# Patient Record
Sex: Male | Born: 1945 | Race: White | Hispanic: No | Marital: Married | Smoking: Former smoker
Health system: Southern US, Community
[De-identification: ages and names within clinical notes are randomized; demographics above are authoritative.]

## PROBLEM LIST (undated history)

## (undated) DIAGNOSIS — R42 Dizziness and giddiness: Secondary | ICD-10-CM

## (undated) DIAGNOSIS — I714 Abdominal aortic aneurysm, without rupture, unspecified: Secondary | ICD-10-CM

## (undated) DIAGNOSIS — E782 Mixed hyperlipidemia: Secondary | ICD-10-CM

## (undated) DIAGNOSIS — I2510 Atherosclerotic heart disease of native coronary artery without angina pectoris: Secondary | ICD-10-CM

## (undated) DIAGNOSIS — I723 Aneurysm of iliac artery: Secondary | ICD-10-CM

## (undated) HISTORY — DX: Dizziness and giddiness: R42

## (undated) HISTORY — DX: Aneurysm of iliac artery: I72.3

## (undated) HISTORY — DX: Mixed hyperlipidemia: E78.2

## (undated) HISTORY — DX: Atherosclerotic heart disease of native coronary artery without angina pectoris: I25.10

## (undated) HISTORY — DX: Abdominal aortic aneurysm, without rupture: I71.4

## (undated) HISTORY — DX: Abdominal aortic aneurysm, without rupture, unspecified: I71.40

---

## 2004-02-06 ENCOUNTER — Ambulatory Visit (HOSPITAL_COMMUNITY): Admission: RE | Admit: 2004-02-06 | Discharge: 2004-02-06 | Payer: Self-pay | Admitting: Orthopedic Surgery

## 2005-10-30 ENCOUNTER — Encounter: Payer: Self-pay | Admitting: Emergency Medicine

## 2005-10-31 ENCOUNTER — Inpatient Hospital Stay (HOSPITAL_COMMUNITY): Admission: EM | Admit: 2005-10-31 | Discharge: 2005-11-01 | Payer: Self-pay | Admitting: Internal Medicine

## 2005-10-31 ENCOUNTER — Ambulatory Visit: Payer: Self-pay | Admitting: *Deleted

## 2005-11-20 ENCOUNTER — Ambulatory Visit: Payer: Self-pay | Admitting: *Deleted

## 2006-01-01 ENCOUNTER — Ambulatory Visit: Payer: Self-pay | Admitting: Cardiology

## 2006-01-20 ENCOUNTER — Ambulatory Visit: Payer: Self-pay | Admitting: Cardiology

## 2006-04-10 ENCOUNTER — Ambulatory Visit: Payer: Self-pay

## 2006-04-10 ENCOUNTER — Ambulatory Visit: Payer: Self-pay | Admitting: Cardiology

## 2006-05-21 ENCOUNTER — Ambulatory Visit: Payer: Self-pay | Admitting: Cardiology

## 2006-10-21 ENCOUNTER — Ambulatory Visit: Payer: Self-pay | Admitting: Cardiology

## 2006-10-27 ENCOUNTER — Ambulatory Visit: Payer: Self-pay | Admitting: Cardiology

## 2006-10-27 LAB — CONVERTED CEMR LAB
ALT: 21 units/L (ref 0–53)
AST: 20 units/L (ref 0–37)
Albumin: 3.6 g/dL (ref 3.5–5.2)
Alkaline Phosphatase: 105 units/L (ref 39–117)
BUN: 14 mg/dL (ref 6–23)
Basophils Absolute: 0 10*3/uL (ref 0.0–0.1)
Basophils Relative: 0.6 % (ref 0.0–1.0)
Bilirubin, Direct: 0.1 mg/dL (ref 0.0–0.3)
CO2: 27 meq/L (ref 19–32)
Calcium: 9 mg/dL (ref 8.4–10.5)
Chloride: 113 meq/L — ABNORMAL HIGH (ref 96–112)
Cholesterol: 110 mg/dL (ref 0–200)
Creatinine, Ser: 1 mg/dL (ref 0.4–1.5)
Eosinophils Absolute: 0.2 10*3/uL (ref 0.0–0.6)
Eosinophils Relative: 3.7 % (ref 0.0–5.0)
GFR calc Af Amer: 99 mL/min
GFR calc non Af Amer: 82 mL/min
Glucose, Bld: 101 mg/dL — ABNORMAL HIGH (ref 70–99)
HCT: 41 % (ref 39.0–52.0)
HDL: 32 mg/dL — ABNORMAL LOW (ref >39.0)
Hemoglobin: 14.1 g/dL (ref 13.0–17.0)
LDL Cholesterol: 50 mg/dL (ref 0–99)
Lymphocytes Relative: 33.5 % (ref 12.0–46.0)
MCHC: 34.4 g/dL (ref 30.0–36.0)
MCV: 89.8 fL (ref 78.0–100.0)
Monocytes Absolute: 0.4 10*3/uL (ref 0.2–0.7)
Monocytes Relative: 6.1 % (ref 3.0–11.0)
Neutro Abs: 3.8 10*3/uL (ref 1.4–7.7)
Neutrophils Relative %: 56.1 % (ref 43.0–77.0)
Platelets: 302 10*3/uL (ref 150–400)
Potassium: 4 meq/L (ref 3.5–5.1)
RBC: 4.56 M/uL (ref 4.22–5.81)
RDW: 12.8 % (ref 11.5–14.6)
Sodium: 144 meq/L (ref 135–145)
TSH: 2.92 microintl units/mL (ref 0.35–5.50)
Total Bilirubin: 0.8 mg/dL (ref 0.3–1.2)
Total CHOL/HDL Ratio: 3.4
Total Protein: 6.2 g/dL (ref 6.0–8.3)
Triglycerides: 141 mg/dL (ref 0–149)
VLDL: 28 mg/dL (ref 0–40)
WBC: 6.6 10*3/uL (ref 4.5–10.5)

## 2007-11-04 ENCOUNTER — Encounter: Admission: RE | Admit: 2007-11-04 | Discharge: 2007-11-04 | Payer: Self-pay | Admitting: Family Medicine

## 2008-12-28 ENCOUNTER — Encounter: Admission: RE | Admit: 2008-12-28 | Discharge: 2008-12-28 | Payer: Self-pay | Admitting: Family Medicine

## 2009-11-27 ENCOUNTER — Encounter: Payer: Self-pay | Admitting: Cardiology

## 2009-11-28 ENCOUNTER — Telehealth: Payer: Self-pay | Admitting: Cardiology

## 2009-11-28 ENCOUNTER — Encounter: Admission: RE | Admit: 2009-11-28 | Discharge: 2009-11-28 | Payer: Self-pay | Admitting: Family Medicine

## 2009-12-21 ENCOUNTER — Telehealth: Payer: Self-pay | Admitting: Cardiology

## 2010-01-16 ENCOUNTER — Ambulatory Visit: Payer: Self-pay | Admitting: Cardiology

## 2010-01-16 DIAGNOSIS — E782 Mixed hyperlipidemia: Secondary | ICD-10-CM | POA: Insufficient documentation

## 2010-01-16 DIAGNOSIS — R42 Dizziness and giddiness: Secondary | ICD-10-CM | POA: Insufficient documentation

## 2010-01-16 DIAGNOSIS — I2510 Atherosclerotic heart disease of native coronary artery without angina pectoris: Secondary | ICD-10-CM | POA: Insufficient documentation

## 2010-01-17 ENCOUNTER — Telehealth: Payer: Self-pay | Admitting: Cardiology

## 2010-05-15 ENCOUNTER — Telehealth: Payer: Self-pay | Admitting: Cardiology

## 2010-05-22 NOTE — Assessment & Plan Note (Signed)
Summary: f3y      Allergies Added:   Visit Type:  Follow-up 3 year  CC:  pt states he uses patches and gum he does not really smoke. Pt  had one eposide of dizziness he went to Dr had Ct Scan pt states he has not had any more dizziness since then.  History of Present Illness: The patient is 65 years old and return for management of CAD and for evaluation of dizziness and an irregular heartbeat. In 2007 he had a non-ST elevation MI and was treated with multiple drug-eluting stents to the circumflex and right coronary artery. His ejection fraction was 45% at that time.  He is doing well since that time. Recently he developed symptoms of dizziness and had documented PACs on echocardiogram by his primary care physician. The ECG was faxed to [place] and showed PACs. Since that time his symptoms have resolved.  His other medical problems include hypertension hyperlipidemia.  He works for [employer]  He has been able to stop smoking but still uses Nicorette, and patches from time to time.  Current Medications (verified): 1)  Enalapril Maleate 2.5 Mg Tabs (Enalapril Maleate) .Marland Kitchen.. 1 Tab Once Daily 2)  Metoprolol Succinate 25 Mg Xr24h-Tab (Metoprolol Succinate) .... Take One Tablet By Mouth Daily 3)  Plavix 75 Mg Tabs (Clopidogrel Bisulfate) .Marland Kitchen.. 1 Tab Once Daily 4)  Aspirin 81 Mg Tbec (Aspirin) .... Take One Tablet By Mouth Daily 5)  Red Yeast .... 1 Tab Qd 6)  Multi Vitamin .Marland Kitchen.. 1 Tab Once Daily 7)  Lipitor 40 Mg Tabs (Atorvastatin Calcium) .Marland Kitchen.. 1 Tab Once Daily  Allergies (verified): 1)  ! Pcn  Past History:  Past Medical History: Reviewed history from 01/16/2010 and no changes required.  1. Coronary artery disease status post non-ST-elevation myocardial       infarction in July 2007 with multi vessel percutaneous coronary       intervention of the circumflex and right coronary artery with       multiple drug-eluting stents.   2. Ejection fraction 46%.   3.  Hyperlipidemia with low HDL.   4. Previous cigarette use.   5. history of Peyronie's disease  Review of Systems       ROS is negative except as outlined in HPI.   Vital Signs:  Patient profile:   65 year old male Height:      69 inches Weight:      194 pounds BMI:     28.75 Pulse rate:   77 / minute Pulse (ortho):   53 / minute BP sitting:   106 / 71  (left arm) BP standing:   102 / 66 Cuff size:   large  Vitals Entered By: Burnett Kanaris, CNA (January 16, 2010 2:30 PM)  Serial Vital Signs/Assessments:  Time      Position  BP       Pulse  Resp  Temp     By 0 min     Lying RA  100/70   47                    Burnett Kanaris, CNA 0 min     Sitting   98/65    51                    Burnett Kanaris, CNA 0 min     Standing  102/66   53  Burnett Kanaris, CNA 2 min     Standing  98/68    55                    Burnett Kanaris, CNA 3 min     Standing  93/60    64                    Burnett Kanaris, CNA  Comments: 0 min no dizziness  By: Burnett Kanaris, CNA  2 min no dizziness By: Burnett Kanaris, CNA  3 min  no dizziness By: Burnett Kanaris, CNA    Physical Exam  Additional Exam:  Gen. Well-nourished, in no distress   Neck: No JVD, thyroid not enlarged, no carotid bruits Lungs: No tachypnea, clear without rales, rhonchi or wheezes Cardiovascular: Rhythm regular, PMI not displaced,  heart sounds  normal, no murmurs or gallops, no peripheral edema, pulses normal in all 4 extremities. Abdomen: BS normal, abdomen soft and non-tender without masses or organomegaly, no hepatosplenomegaly. MS: No deformities, no cyanosis or clubbing   Neuro:  No focal sns   Skin:  no lesions    Impression & Recommendations:  Problem # 1:  CORONARY ATHEROSCLEROSIS NATIVE CORONARY ARTERY (ICD-414.01) He had multiple drug-eluting stents in the right coronary and circumflex artery after a non-ST elevation MI in 2007. He's had no recent chest pain this appears stable. His updated medication  list for this problem includes:    Enalapril Maleate 2.5 Mg Tabs (Enalapril maleate) .Marland Kitchen... 1 tab once daily    Metoprolol Succinate 25 Mg Xr24h-tab (Metoprolol succinate) .Marland Kitchen... Take one tablet by mouth daily    Plavix 75 Mg Tabs (Clopidogrel bisulfate) .Marland Kitchen... 1 tab once daily    Aspirin 81 Mg Tbec (Aspirin) .Marland Kitchen... Take one tablet by mouth daily  Orders: EKG w/ Interpretation (93000)  Problem # 2:  MIXED HYPERLIPIDEMIA (ICD-272.2) This is managed with Lipitor and followed by his primary care physician. His updated medication list for this problem includes:    Lipitor 40 Mg Tabs (Atorvastatin calcium) .Marland Kitchen... 1 tab once daily  Orders: EKG w/ Interpretation (93000)  Problem # 3:  DIZZINESS (ICD-780.4) He has been having symptoms of dizziness in the past.  His ECG shows APCs and is otherwise normal. He is blood pressure is on the low side but there is no significant postural drop. His symptoms have since resolved. Since he is not having any recurrent symptoms I don't think we need to evaluate this further at this time. He has more problems and will consider evaluation later.  Orders: EKG w/ Interpretation (93000)  Patient Instructions: 1)  Your physician recommends that you continue on your current medications as directed. Please refer to the Current Medication list given to you today. 2)  Your physician wants you to follow-up in: 1 year with Dr. Clifton James.  You will receive a reminder letter in the mail two months in advance. If you don't receive a letter, please call our office to schedule the follow-up appointment.

## 2010-05-22 NOTE — Progress Notes (Signed)
Summary: needs refiil   Phone Note Refill Request Call back at Home Phone [phone] Message from:  Patient on [facility] [address]  Refills Requested: Medication #1:  LIPITOR 40 MG TABS 1 tab once daily. Initial call taken by: Omer Jack,  January 17, 2010 4:11 PM  Follow-up for Phone Call        The pt's wife is aware this has been done. Follow-up by: Sherri Rad, RN, BSN,  January 17, 2010 4:18 PM    Prescriptions: LIPITOR 40 MG TABS (ATORVASTATIN CALCIUM) 1 tab once daily  #90 x 3   Entered by:   Sherri Rad, RN, BSN   Authorized by:   Lenoria Farrier, MD, [facility]   Signed by:   Sherri Rad, RN, BSN on 01/17/2010   Method used:   Electronically to        [facility] # [phone]* (retail)       [address]       [place], [place]  [address]       Ph: [phone]       Fax: [phone]   RxID:   [id number]

## 2010-05-22 NOTE — Letter (Signed)
Summary: Grant Shea Office Visit Note   Grant Shea Office Visit Note   Imported By: Roderic Ovens 12/07/2009 15:54:03  _____________________________________________________________________  External Attachment:    Type:   Image     Comment:   External Document

## 2010-05-22 NOTE — Progress Notes (Signed)
Summary: c/o dizzy spell    Phone Note Call from Patient Call back at Home Phone [phone]   Caller: Spouse Reason for Call: Talk to Nurse Details for Reason: c/o dizzy spell. went to pcp about 2 weeks ago. ekg was done show irregular heart beat. office to faxed results over.  Initial call taken by: Lorne Skeens,  November 28, 2009 9:07 AM  Follow-up for Phone Call        I spoke with the pt's wife. She states the pt started to c/o dizzy spells about 1-2 weeks ago. On 8/1, he saw his PCP and no test were ordered, but hydration was encouraged. The pt. continued with symptoms of dizziness and they went back to see his PCP- Dr. [name], yesterday. Per the pt's wife, an EKG was done that showed an irregular heart beat. Dr. Paulino Rily has also ordered a head CT today to be done at [facility] at 4:30pm. The pt's last episode of dizziness was last night. He has never felt pre-syncopal with these episodes. The pt. notes he feels dizzy when he holds his head back or turns his head a certain way at work. He also feels dizzy when he lays down. The pt's blood pressure readings have been ok at his PCP visits. The pt's wife states Dr. Paulino Rily was to fax a copy of the EKG to [place], but I have not seen this. I explained I will call to get a copy of this and possibly her last office note and I will call the pt's wife back after reviewing these. She is agreeable. Follow-up by: Sherri Rad, RN, BSN,  November 28, 2009 9:38 AM  Additional Follow-up for Phone Call Additional follow up Details #1::        I received a call back from medical records at [facility]. Their fax is down and they will send records as soon as possible. Sherri Rad, RN, BSN  November 28, 2009 11:00 PM   No fax received. I have called and left the pt a message that we will call Dr. Paulino Rily office tomorrow and see if their fax machine is up so we may get his EKG and a copy of the office note. Additional Follow-up by: Sherri Rad, RN,  BSN,  November 28, 2009 5:30 PM    Additional Follow-up for Phone Call Additional follow up Details #2::    spoke with Eunice Blase in medical records she faxediEKG this morning. Spoke with Asher Muir she has sent them around to message nurse We have not received yet. Dennis Bast, RN, BSN  November 29, 2009 10:34 AM  We received will have Dr Juanda Chance review Dennis Bast, RN, BSN  November 29, 2009 10:49 AM Dr Juanda Chance reviewed EKG looks Rip Harbour shows SR w/ PAC's Doesn't explain dizizziness. I  called pt's wife and let her know Dr Juanda Chance had reviewed Dennis Bast, RN, BSN  November 29, 2009 2:11 PM

## 2010-05-22 NOTE — Miscellaneous (Signed)
  Clinical Lists Changes  Medications: Added new medication of ENALAPRIL MALEATE 2.5 MG TABS (ENALAPRIL MALEATE) 1 tab once daily Added new medication of METOPROLOL SUCCINATE 25 MG XR24H-TAB (METOPROLOL SUCCINATE) Take one tablet by mouth daily Added new medication of PLAVIX 75 MG TABS (CLOPIDOGREL BISULFATE) 1 tab once daily Added new medication of ASPIRIN 81 MG TBEC (ASPIRIN) Take one tablet by mouth daily Added new medication of * RED YEAST 1 tab qd Added new medication of * MULTI VITAMIN 1 tab once daily Added new medication of LIPITOR 40 MG TABS (ATORVASTATIN CALCIUM) 1 tab once daily Allergies: Added new allergy or adverse reaction of PCN Observations: Added new observation of ALLERGY REV: Done (01/16/2010 13:08) Added new observation of NKA: F (01/16/2010 13:08) Added new observation of MEDRECON: current updated (01/16/2010 13:08)      Current Medications (verified): 1)  Enalapril Maleate 2.5 Mg Tabs (Enalapril Maleate) .Marland Kitchen.. 1 Tab Once Daily 2)  Metoprolol Succinate 25 Mg Xr24h-Tab (Metoprolol Succinate) .... Take One Tablet By Mouth Daily 3)  Plavix 75 Mg Tabs (Clopidogrel Bisulfate) .Marland Kitchen.. 1 Tab Once Daily 4)  Aspirin 81 Mg Tbec (Aspirin) .... Take One Tablet By Mouth Daily 5)  Red Yeast .... 1 Tab Qd 6)  Multi Vitamin .Marland Kitchen.. 1 Tab Once Daily 7)  Lipitor 40 Mg Tabs (Atorvastatin Calcium) .Marland Kitchen.. 1 Tab Once Daily  Allergies (verified): 1)  ! Pcn

## 2010-05-22 NOTE — Progress Notes (Signed)
Summary: pt having dizzy spells   Phone Note Call from Patient   Caller: Spouse [phone] emily Reason for Call: Talk to Nurse Summary of Call: pt having dizzy spells x 1 month-has seen pcp about this, did ct scan did not show anything, bloodwork normal as well-wondering if has to do anything with the stents in his heart?-pls call wife emily [phone] Initial call taken by: Glynda Jaeger,  December 21, 2009 9:20 AM  Follow-up for Phone Call        Spoke with pt's wife. She states pt . is having the same dizzy spells he has been having for a month. She would like to know it the dizzy spells have anything to do with the stents pt. has in his heart. I let pt's wife kow that if it was his stents pt. would be having the same symptoms as prior stent placement. Pt denies any of those symptoms.  Wife verbalized understanding. Pt. has an appointment  with Dr. Juanda Chance on Sept. 27th/11 2:45 PM. Follow-up by: Ollen Gross, RN, BSN,  December 21, 2009 9:45 AM

## 2010-05-24 NOTE — Progress Notes (Signed)
Summary: refill request-pt out of med   Phone Note Refill Request Message from:  Patient on May 15, 2010 9:16 AM  Refills Requested: Medication #1:  METOPROLOL SUCCINATE 25 MG XR24H-TAB Take one tablet by mouth daily [facility] [address]   Method Requested: Telephone to Pharmacy Initial call taken by: Glynda Jaeger,  May 15, 2010 9:17 AM    Prescriptions: METOPROLOL SUCCINATE 25 MG XR24H-TAB (METOPROLOL SUCCINATE) Take one tablet by mouth daily  #90 x 3   Entered by:   Burnett Kanaris, CNA   Authorized by:   Verne Carrow, MD   Signed by:   Burnett Kanaris, CNA on 05/15/2010   Method used:   Electronically to        [facility] # [phone]* (retail)       [address]       [place], [place]  [address]       Ph: [phone]       Fax: [phone]   RxID:   [id number]

## 2010-09-04 NOTE — Assessment & Plan Note (Signed)
[facility]                            CARDIOLOGY OFFICE NOTE   KOLTEN, RYBACK                    MRN:          [id number]  DATE:10/21/2006                            DOB:          11-Jun-1945    PRIMARY CARE PHYSICIAN:  Dr. Laurine Blazer at [facility] [place].   CLINICAL HISTORY:  Mr. Ines is 65 years old and, in July of 2007,  had a non-ST-elevation myocardial infarction, and underwent multivessel  PCI with overlapping TAXUS stents in the circumflex artery, 3  overlapping TAXUS stents in the right coronary artery, and a TAXUS stent  in the marginal branch of the circumflex artery.  He has done quite well  since that time.  He has had no recent chest pain, shortness of breath,  or palpitations.  He has had some easy bruisability on the Plavix and  aspirin.  His ejection fraction has been 45% by last Myoview scan.   PAST MEDICAL HISTORY:  Significant for hyperlipidemia.  He also has a  history of smoking and has been able to get off with the help of  Chantix, but does smoke cigars with a filter.   CURRENT MEDICATIONS:  Enalapril.  Toprol.  Plavix.  Aspirin.  Lipitor.  Red yeast rice.   EXAMINATION:  Blood pressure is 117/72, pulse 55 and regular.  There was no venous distension.  The carotid pulses were full without  bruits.  CHEST:  Clear without rales or rhonchi.  CARDIAC:  Rhythm was regular.  The heart sounds were normal.  There were  no murmurs or gallops.  ABDOMEN:  Soft with normal bowel sounds.  There is no  hepatosplenomegaly.  Peripheral pulses were full.  There is no peripheral edema.   An electrocardiogram was normal with 1 PVC.   IMPRESSION:  1. Coronary artery disease status post non-ST-elevation myocardial      infarction in July 2007 with multi vessel percutaneous coronary      intervention of the circumflex and right coronary artery with      multiple drug-eluting stents.  2. Ejection fraction 46%.  3.  Hyperlipidemia with low HDL.  4. Previous cigarette use.   RECOMMENDATIONS:  I think Mr. Plasencia is doing well.  He is having  some easy bruisability on the Plavix, but this is not too much of a  problem for him, and he is fine with continuing Plavix, which I would  recommend.  We will plan to get some fasting blood work, including  lipids, liver, CBC, BNP, and TSH.  I will plan to see him back in a  year.  I encouraged him to make an appointment with primary care in 6  months.  He had previously seen Dr. Cloyde Reams with  [facility], who has since left, and his wife sees Dr. Laurine Blazer  there, and she indicates that she will try to get him in to see Dr.  Laurine Blazer.     Bruce Elvera Lennox Juanda Chance, MD, [facility]  Electronically Signed    BRB/MedQ  DD: 10/21/2006  DT: 10/21/2006  Job #: [id number]

## 2010-09-07 NOTE — H&P (Signed)
NAMEJOVE, BEYL NO.:  [id number]   MEDICAL RECORD NO.:  [id number]          PATIENT TYPE:  INP   LOCATION:  6533                         FACILITY:  [facility]   PHYSICIAN:  Duke Salvia, M.D.  DATE OF BIRTH:  03/18/1946   DATE OF ADMISSION:  10/31/2005  DATE OF DISCHARGE:                                HISTORY & PHYSICAL   CHIEF COMPLAINT:  Chest pain.   HISTORY OF PRESENT ILLNESS:  Grant Shea is a 65 year old gentleman with  a history of Peyronie's disease without a known history of coronary artery  disease.  He is a smoker who quit 2 weeks ago.  He has a family history of  MI and unknown cholesterol status.  He was having intercourse with his wife,  when he experienced substernal chest pain without radiation.  He presented  to the emergency room about 45 minutes after presentation, already chest-  pain-free.   ALLERGIES:  No known drug allergies.   MEDICATIONS:  None.   PAST MEDICAL HISTORY:  Peyronie's disease.   SOCIAL HISTORY:  He lives in [place] with relatives.  Smoking history:  Greater than 30-pack-year smoker.  Occasional drinker.  No drugs.   FAMILY HISTORY:  Mother died of complications of coronary artery disease.   REVIEW OF SYSTEMS:  CONSTITUTIONAL:  No fevers, chills or sweats.  No weight  change or adenopathy.  HEENT:  No headache.  No voice changes.  No vertigo.  No photophobia.  SKIN CHANGES:  No rashes.  No lesions.  CARDIOPULMONARY:  Per HPI.  GU:  No frequency or dysuria.  NEUROPSYCHIATRIC:  No weakness,  numbness or mood disturbance.  MUSCULOSKELETAL:  No myalgias.  No swelling.  No deformities.  GI:  No nausea, vomiting, diarrhea, bright red blood per  rectum or melena.  ENDOCRINE:  No polyuria or polydipsia.   PHYSICAL EXAMINATION:  VITAL SIGNS:  Temperature is 98.7, pulse is 50,  respiratory rate is 18, blood pressure is 94/70 and pulse oximetry is 98% on  room air.  GENERAL:  He is in no apparent distress, alert and  oriented x3.  HEENT:  He is normocephalic, atraumatic.  Pupils are equal, round and  reactive to light and accommodation.  Extraocular motions intact.  Moist  mucous membranes.  NECK:  Supple.  Jugular venous pulsations are less than 7 cmH2O with the  head of the bed at 30 degrees.  LYMPHADENOPATHY:  None.  CARDIOVASCULAR:  PMI mildly laterally deviated.  S1 and S2.  Otherwise  normal, without murmurs, rubs, or gallops.  LUNGS:  Clear to auscultation without adventitial sounds.  SKIN:  No rashes.  ABDOMEN:  Soft and nontender with positive bowel sounds.  Liver is 4 cm by  scratch test.  RECTAL:  Stool is brown and heme-negative.  EXTREMITIES:  No clubbing, cyanosis, or edema.  MUSCULOSKELETAL:  No joint deformities.  NEUROLOGIC:  Alert and oriented x3.  Cranial nerves II-XII are intact.  Deep  tendon reflexes are intact.  Strength 5/5 of all extremities.  Normal gait  was appreciated.   LABORATORY AND ACCESSORY CLINICAL DATA:  Chest x-ray  pending.   EKG:  Rate 53, rhythm sinus bradycardia, axis normal, P-R interval of 210,  QRS duration of 92, QTc corrected at 404.  T wave inversions are noted in I  and aVL and V1 and V2.   LABORATORY DATA:  White count 9.2, hematocrit 40.3, platelets 360,000.  Sodium 138, potassium 3.7, chloride is 105, bicarb is 25, BUN is 24,  creatinine is 1.1, glucose is 152.  AST is 25, ALT is 18.  Troponin I is  0.08.  MB is normal.  Myoglobin is elevated at 311.   ASSESSMENT AND PLAN:  This is a 65 year old gentleman who presented with  symptoms of unstable angina with positive cardiac enzymes.  We are admitting  him to Telemetry with a diagnosis of non-ST-segment-elevation myocardial  infarction and suggest initial medication titration followed by cardiac  catheterization.  We will initiate heparin, IIb/IIIa inhibitor, beta  blocker, aspirin and statin, and ACE inhibitor if blood pressure tolerates.      Denyse Amass, MD       ______________________________  Duke Salvia, M.D.    DBH/MEDQ  D:  10/31/2005  T:  10/31/2005  Job:  [id number]

## 2010-09-07 NOTE — Assessment & Plan Note (Signed)
[facility]                              CARDIOLOGY OFFICE NOTE   JACKSON, COFFIELD                    MRN:          [id number]  DATE:01/20/2006                            DOB:          [birth year]    PRIMARY CARE PHYSICIAN:  Schuyler Amor, M.D.   CLINICAL HISTORY:  Mr. Lemay is 65 years old and works for [employer] in [employer] [place].  He was  hospitalized in July with a non-ST elevation myocardial infarction and  underwent multivessel percutaneous coronary intervention with two  overlapping Taxus stents placed in the circumflex artery, a Taxus stent  placed in the circumflex marginal vessel, and three overlapping Taxus stents  placed in the right coronary artery.   He has done quite well since that time and has had no chest pain, shortness  of breath, palpitations.  He has had excessive bruising with very minimal  bumps with very minimal trauma.   PAST MEDICAL HISTORY:  Significant for hyperlipidemia and a history of  Peronei's disease.   CURRENT MEDICATIONS:  1. Aspirin.  2. Enalapril.  3. Toprol.  4. Plavix.  5. Lipitor.   PHYSICAL EXAMINATION:  VITAL SIGNS:  On examination today his blood pressure  is 123/63 and pulse is 49 and regular.  NECK:  There was no venous distention.  Carotid pulses were full without  bruits.  CHEST:  Clear.  HEART:  Regular rate and rhythm.  He had no murmurs, rubs or gallops.  ABDOMEN:  Soft, normal bowel sounds.  EXTREMITIES: Peripheral pulses are full with no peripheral edema.  There  were marked ecchymoses on the trunk and limbs.   CLINICAL DATA:  ECG was normal with sinus bradycardia.   IMPRESSION:  1. Coronary artery disease status post non-ST elevation myocardial      infarction, July 2007, with percutaneous coronary intervention of the      circumflex, circumflex marginal and proximal mid and distal right      coronary artery using Taxus  drug-eluting stents, now stable.  2. Good left ventricular function.  3. Hyperlipidemia with persistently low HDL.  4. Easy bruisability.   RECOMMENDATIONS:  I think Mr. Alcorn is doing well from a cardiac  standpoint.  The Plavix is extremely important with his multiple drug-  eluting stents.  We will cut back his aspirin from 325 to 81 and hopefully  this will help.  We will get a CBC to look at his platelet count and see if  there is any other contributing factors.  Although he has had an excessive  amount of bruising, he is willing to bear with this to stay on his Plavix  since he knows the importance of that.  He indicated that Lipitor can lower  the HDL, which is correct and since his HDL has remained low, will plan to  switch him to Vytorin 10/40 from Lipitor when he runs out.  Will get a lipid  liver profile one month after he makes that change.  I will see him back in  three months, which will be six months  post-intervention and will plan a  rest stress exercise Myoview scan prior to that visit to assess him for  restenosis.            ______________________________  Everardo Beals Juanda Chance, MD, [facility]     BRB/MedQ  DD:  01/20/2006  DT:  01/21/2006  Job #:  [id number]   cc:   Schuyler Amor, M.D.

## 2010-09-07 NOTE — Cardiovascular Report (Signed)
NAME:  Grant Shea, Grant Shea NO.:  [id number]   MEDICAL RECORD NO.:  [id number]          PATIENT TYPE:  INP   LOCATION:  6533                         FACILITY:  [facility]   PHYSICIAN:  Charlies Constable, M.D. [facility] DATE OF BIRTH:  1945-04-27   DATE OF PROCEDURE:  10/31/2005  DATE OF DISCHARGE:                              CARDIAC CATHETERIZATION   CLINICAL HISTORY:  Grant Shea is 65 years old and has no prior history of  known heart disease although is a smoker and does have a positive family  history of heart disease.  He came the emergency room with prolonged chest  pain and had positive markers consistent with a non-ST-elevation infarction.  He is seen by Dr. Jaynie Collins and set up for evaluation with  angiography today.   PROCEDURE:  The procedure was performed via the right femoral artery using  an arterial sheath and 6-French preformed coronary catheters.  A femoral  wall arterial puncture was performed and Omnipaque contrast was used.  After  completion of the diagnostic study we made a decision to proceed with  intervention on the circumflex and right coronary arteries.   The patient was on an Integrilin drip and was given additional heparin,  prolonging the ACT to greater than 200 seconds.  He also was given 600mg  of  Plavix and 20 mg of Pepcid.   We first approached the circumflex artery.  We used a CLS4 6-French guiding  catheter with side holes.  We first passed a Prowater wire down the AV  circumflex artery and predilated the tandem lesions with a 2.0 x 15 mm  Maverick balloon, performing three inflations up to 8 atmospheres for 30  seconds.  We then deployed a 2.5 x 24 mm Taxus stent in the proximal portion  of the lesion with the proximal edge of the stent just past the large  marginal branch.  We deployed this with one inflation of 10 atmospheres for  30 seconds.  We then passed a 2.5 x 12 mm Taxus stent through the first  stent and just overlapping the  second stent and ended the second stent just  before a bifurcation and posterolateral branches.  We deployed this with one  inflation of 11 atmospheres for 30 seconds.  We then postdilated both  overlapping stents with a 2.5 x 20 mm Quantum Maverick, performing two  inflations up to 16 atmospheres for 30 seconds.   We then approached the circumflex marginal vessel.  We reshaped the wire and  advanced a Prowater wire into the marginal vessel and across the lesion.  We  predilated with a 2.0 x 50 mm Maverick, performing one inflation up to 10  atmospheres for 30 seconds.  We then deployed a 2.5 x 16 mm Taxus stent,  deploying this with one inflation of 12 atmospheres for 30 seconds.  We  postdilated with a 2.75 x 12 mm Quantum Maverick, performing two inflation  up to 16 atmospheres for 30 seconds.   We then approached the right coronary artery.  When we took the first  additional picture of the right coronary artery, the  right coronary was  totally occluded with TIMI I flow.  We were able to navigate a Prowater wire  down the vessel and this reestablished some flow.  We predilated up and down  the vessel with a 2.0 x 20 mm Maverick balloon, performing multiple  inflations up to 8 atmospheres for 30 seconds.  We then deployed a 2.5 x 24  mm Taxus stent in the distal portion of the vessel, deploying this with one  inflation of 11 atmospheres for 30 seconds.  We deployed a second 2.5 x 24  mm Taxus stent just overlapping the first stent and deployed this with one  inflation up to 14 atmospheres for 30 seconds.  We then deployed a 2.75 x 32  mm Taxus stent just overlapping the second stent, and deployed this with one  inflation up to 12 atmospheres for 30 seconds.  We postdilated all three  stents with a 3.0 x 20 mm Quantum Maverick, performing a total of four  inflations up to 16 atmospheres for 30 seconds.  Final diagnostic study was  then performed through the guiding catheter.  The patient  tolerated the  procedure well and left the laboratory in satisfactory condition.   RESULTS:  There was no left main coronary artery since there were separate  ostia of the LAD and circumflex artery.   The left anterior descending artery gave rise to three diagonal branches and  a septal perforator.  There was 40% narrowing after the first diagonal  branch before the septal perforator.   The circumflex artery gave rise to an atrial branch, a marginal branch, and  an AV branch which terminated into small posterolateral branches.  There  were tandem 95%, 80%, and 90% stenoses in the mid portion of the circumflex  artery.  There was a 90% stenosis in the large marginal branch.   The right coronary artery is a moderate-size vessel, gave rise to a right  ventricle branch, a posterior descending branch, and two small  posterolateral branches.  There was 95% stenosis in the proximal vessel,  tandem 70% stenoses in the mid vessel, and 90% stenosis in  the distal  vessel.  Initially the flow was TIMI 3 flow, but prior to the intervention  the flow had decreased to TIMI 1 despite the fact that there was no pain or  ST elevation.   The left ventriculogram performed in the RAO projection showed inferobasal  wall hypokinesis.  The overall wall motion was slightly reduced with an  estimated fraction of 45%.   Following placement of tandem overlapping Taxus stents in the mid AV  circumflex artery, the stenosis improved from 95% to 0%.   Following placement of a Taxus stent in the marginal branch of the  circumflex artery, stenosis improved from 90% to 0%.   Following placement of three tandem overlapping Taxus stents in the  proximal, mid and distal right coronary artery, the stenoses improved from  95% to 0%.   CONCLUSION:  1.  Coronary artery status post recent non-ST-elevation myocardial     infarction with 40% narrowing in the proximal left anterior descending      coronary artery; 95%,  80% and 90% stenoses in the mid circumflex artery;      90% stenosis in the first marginal branch of the circumflex artery; 95%      proximal, 70% mid and 90% distal stenoses in the right coronary; and      inferobasal wall hypokinesis with an estimated fraction of  45%.  2.  Successful stenting of the AV circumflex artery with using two      overlapping Taxus stents with improvement in percent diameter narrowing      from 95% to 0%.  3.  Successful percutaneous coronary intervention of the lesion in the      marginal branch of the circumflex artery using a Taxus drug-eluting      stent with improvement in percent diameter narrowing from 90% to 0%.  4.  Successful percutaneous coronary intervention of the proximal, mid and      distal lesions in the right coronary      using three overlapping Taxus drug-eluting stents with improvement in      percent of diameter narrowing from 95% to 0%.   DISPOSITION:  The patient returned to the post angioplasty unit for further  observation.           ______________________________  Charlies Constable, M.D. [facility]     BB/MEDQ  D:  10/31/2005  T:  10/31/2005  Job:  [id number]   cc:   Cardiopulmonary Lab

## 2010-09-07 NOTE — Discharge Summary (Signed)
NAMEIOAN, LANDINI NO.:  [id number]   MEDICAL RECORD NO.:  [id number]          PATIENT TYPE:  EMS   LOCATION:  ED                           FACILITY:  [facility]   PHYSICIAN:  Olga Millers, M.D. LHCDATE OF BIRTH:  09-06-1945   DATE OF ADMISSION:  10/30/2005  DATE OF DISCHARGE:  10/31/2005                           DISCHARGE SUMMARY - REFERRING   SUMMARY/HISTORY:  Mr. Earhart is a 65 year old white male who, while  having intercourse with his wife, developed substernal chest discomfort  without radiation.  After 45 minutes he presented to the emergency room and  was pain free   PAST MEDICAL HISTORY:  Notable for tobacco cessation 2 weeks prior to  admission and Peyronie's disease.   LABORATORY DATA:  Chest x-ray shows mild changes of COPD, no acute  abnormality,  H&H was 13.9 and 40.3, normal indices, platelets 360, WBC 9.2.  Prior to discharge H&H was 13.0 and 38.2, normal indices, platelets 300,  WBCs 10.6.  PTT 32, PT 12.3, sodium 138, potassium 390, BUN 24, creatinine  1.4, normal LFTs.  Prior to discharge potassium 401, BUN 7, creatinine 1.0.  Initial CK-MB was 499, 57.8 with a relative index 11.6 and troponin of 6.69.  Postprocedure on October 31, 2005, total CK was 494, MB 41.2, relative index  8.3 and a troponin of 6.56.  Fasting lipids showed a total cholesterol 188,  triglycerides 127, HDL 34, LDL 129.  Urinalysis was unremarkable. EKGs on  presentation showed sinus bradycardia, first-degree AV block, nonspecific ST-  T wave changes.  Subsequent EKGs were essentially the same except for sinus  bradycardia.   HOSPITAL COURSE:  Mr. Hammen was admitted to __________  placed on  intravenous heparin, aspirin and beta blockers.  It was felt with his  symptoms, he should undergo cardiac catheterization for further evaluation.  ER marker x 1 was negative.  However, total CKMB's came back positive for  non-Q-wave myocardial infarction .  Mr. Candy  underwent cardiac  catheterization on October 31, 2005 by Dr. Juanda Chance.  This revealed extensive  circumflex and RCA disease with EF of 45% inferior hypokinesis.  Dr. Juanda Chance  placed 2 Taxus stents to the mid circumflex, 1 Taxus stent to the OM and 2  Taxus stents, actually 3 Taxus stents, to the RCA.  Post sheath removal and  bedrest, the patient was ambulating without difficulty.  He complained of  dysuria.  The urinalysis was sent and was unremarkable.  Lopressor was held  secondary to sinus bradycardia.  Dr. Juanda Chance assessed the patient on the  evening of October 31, 2005 and felt that if he continued to do well that he  could possibly be discharged in the morning with long term Plavix.  On November 01, 2005, he was ambulating without difficulty.  No further chest discomfort  and Dr. Jens Som felt that the patient could be discharged home.   DISCHARGE DIAGNOSES:  1.  Non-ST elevation myocardial infarction.  2.  Coronary artery disease with stenting to the circumflex, obtuse      marginal, and right coronary artery as previously described above.  3.  Recent tobacco cessation.  4.  Sinus bradycardia.  5.  Peyronie's disease.  6.  Hyperlipidemia.   PROCEDURES:  October 31, 2005 Cardiac catheterization by Dr. Juanda Chance, status  post 6 drug-eluting stents on October 31, 2005 by Dr. Juanda Chance.   DISPOSITION:  Mr. Avakian is discharged home after being seen by tobacco  cessation consult and cardiac rehab and case management to assist with  discharge needs.  He is asked to maintain low salt, fat, cholesterol diet  and he was advised no working, lifting, driving, sexual activity for 2  weeks.  Wound care per supplemental discharge sheet.  He was asked to bring  all medications to all follow-up appointments.   DISCHARGE MEDICATIONS:  1.  Aspirin 325 mg daily.  2.  Enalapril 2.5 mg daily.  3.  Lipitor 80 mg at bedtime.  4.  Toprol XL 25 mg daily.  5.  Plavix 75 mg daily.  6.  Nitroglycerin 0.4 as needed.    DISCHARGE FOLLOWUP:  He will follow up with Dr. Regino Schultze PA, Tereso Newcomer,  on November 20, 2005 at 11:30 as Dr. Juanda Chance does not have any other  appointments available.  He was asked to arrange followup with Dr. Julian Reil  as needed.  He is advised no smoking or tobacco products.  He will need  blood work in 6-8 weeks in regards to fasting lipids and LFTs since Lipitor  was initiated.    Discharge time is less than 30 minutes.      Joellyn Rued, P.A. [facility]    ______________________________  Olga Millers, M.D. [facility]    EW/MEDQ  D:  11/01/2005  T:  11/01/2005  Job:  [id number]   cc:   Charlies Constable, M.D. [facility]  [address]  [address]  [place]  [place] [address]   Schuyler Amor, M.D.  Fax: [phone]

## 2010-09-07 NOTE — Assessment & Plan Note (Signed)
[facility]                              CARDIOLOGY OFFICE NOTE   SUPREME, RYBARCZYK                      MRN:          [id number]  DATE:11/20/2005                            DOB:          08-14-1945    SUBJECTIVE:  Grant Shea is a 65 year old male patient who was recently  admitted to [facility] with non-ST-elevation myocardial infarction.  His peak CK-MB was 67.8 and peak troponin I was 6.69.  He underwent cardiac  catheterization by Dr. Juanda Chance on July 12, and was found to have significant  disease in the mid circumflex and first marginal branch of the circumflex  and RCA.  He only had 40% stenosis in the proximal LAD.  The patient  underwent extensive PCI with placement of two overlapping Taxus stents in  the AV circumflex, one Taxus stent in the obtuse marginal, and three  overlapping Taxus drug-eluding stents to the RCA.  He had an EF of 45% with  inferobasal wall hypokinesis.  His postoperative course was fairly  uneventful.  He did have some sinus bradycardia which limited some of the  use of beta blockers.  The patient was placed on high-dose statin for  dyslipidemia and was discharged on October 31, 2005.   He returns to the office today for followup and is doing well.  He denies  any chest pain, shortness of breath, syncope, presyncope, orthopnea,  paroxysmal nocturnal dyspnea.  Denies any light-headedness or fatigue.  Denies any hemoptysis, fevers, or chills.   CURRENT MEDICATIONS:  1.  Aspirin 325 mg daily.  2.  Enalapril 2.5 mg daily.  3.  Lipitor 80 mg q.h.s.  4.  Toprol XL 25 mg daily.  5.  Plavix 75 mg daily.  6.  Nitroglycerin p.r.n. chest pain.   PHYSICAL EXAMINATION:  GENERAL:  Well-developed, well-nourished male in no  acute distress.  VITAL SIGNS:  Blood pressure 90/68, pulse 47, weight 161 pounds.  HEENT:  Unremarkable.  NECK:  No JVD.  HEART:  Normal S1 and S2.  Regular rate and rhythm.  LUNGS:  Clear to  auscultation bilaterally without wheezing, rhonchi, or  rales.  ABDOMEN:  Soft, nontender with normoactive bowel sounds.  No organomegaly.  EXTREMITIES:  Without edema.  Calves soft and nontender with right femoral  artery site without hematomas or bruits.   LABORATORY DATA:  Electrocardiogram reveals sinus bradycardia with a heart  rate of 47, normal axis, no acute changes.   IMPRESSION:  1.  Coronary artery disease.      1.  Status post non-ST-elevation myocardial infarction treated with          extensive coronary intervention to the circumflex, obtuse marginal,          and right coronary artery, as noted above.  2.  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  3.  Treated dyslipidemia.  4.  Sinus bradycardia.  5.  History of Peronei's disease.   PLAN:  The patient did well from a post-MI standpoint.  He is tolerating his  sinus bradycardia well without any symptoms.  Will continue him  on his  current dose of Toprol, as well as enalapril.  No need to remain on long-  term Plavix, as well as aspirin and Lipitor.  Will follow up on lipids and  LFTs in about six weeks and have him see Dr. Juanda Chance in about eight weeks.  He prefers not to start in cardiac rehab but is doing a good job with  exercising on his own at home.  He will be returning to work in two weeks.                                  Tereso Newcomer, PA-C    SW/MedQ  DD:  11/20/2005  DT:  11/20/2005  Job #:  [id number]   cc:   Schuyler Amor, MD

## 2010-09-07 NOTE — Assessment & Plan Note (Signed)
[facility]                            CARDIOLOGY OFFICE NOTE   OLSON, LUCARELLI                    MRN:          [id number]  DATE:05/21/2006                            DOB:          [birth year]    PRIMARY CARE PHYSICIAN:  Schuyler Amor M.D.   CLINICAL HISTORY:  Mr. Scheel is 65 years old and in July was  admitted with a non-ST elevation myocardial infarction and had  multivessel PCIs with overlapping Taxus stents placed in the circumflex  artery, a Taxus in the marginal branch of the circumflex artery, and 3  overlapping Taxus stents in the right coronary artery.   He has done well from a cardiac standpoint since that time, has had no  recent chest pain, shortness of breath, or palpitations. He recently had  a Myoview scan which showed no major ischemia and ejection fraction of  45%.   PAST MEDICAL HISTORY:  Significant for hyperlipidemia and a history of  Peyronie's disease. He was off cigarettes for 2 months after his  infarction but is now back to smoking intermittently. He also stopped  his Lipitor and is now taking red yeast rice.   CURRENT MEDICATIONS:  Include enalapril, Toprol, Plavix, aspirin, and  red yeast rice.   PHYSICAL EXAMINATION:  VITAL SIGNS:  Blood pressure 122/70, pulse 74 and  regular.  NECK:  There was no venous distension. Carotid pulses are full without  bruits.  CHEST: Clear.  CARDIAC: Rhythm was regular. I could hear no murmurs or gallops.  ABDOMEN: Soft, without organomegaly.  EXTREMITIES:  Peripheral pulses were full. There was no peripheral  edema.   IMPRESSION:  1. Coronary artery disease, status post non-ST elevation myocardial      infarction July 2007 with multivessel PCI of the circumflex and the      right coronary artery using drug-eluting stents.  2. Ejection fraction 46% on recent Myoview scan.  3. Hyperlipidemia with persistently low HDL.  4. Continued cigarette use.   RECOMMENDATIONS:   I think Mr. Westrup is doing well from a cardiac  standpoint but his risk factor modification is suboptimal. He is still  smoking and has gotten of his Lipitor. Will plan to get him Chantix to  help him in stopping smoking and will plan to resume his Lipitor 80 mg a  day. I will plan to see him back in follow up in 6 months.     Bruce Elvera Lennox Juanda Chance, MD, [facility]  Electronically Signed   BRB/MedQ  DD: 05/21/2006  DT: 05/21/2006  Job #: [id number]

## 2012-04-07 ENCOUNTER — Encounter: Payer: Self-pay | Admitting: *Deleted

## 2012-04-08 ENCOUNTER — Ambulatory Visit: Payer: Self-pay | Admitting: Cardiology

## 2012-05-08 ENCOUNTER — Encounter: Payer: Self-pay | Admitting: Cardiology

## 2012-05-08 ENCOUNTER — Ambulatory Visit (INDEPENDENT_AMBULATORY_CARE_PROVIDER_SITE_OTHER): Payer: 59 | Admitting: Cardiology

## 2012-05-08 VITALS — BP 132/70 | HR 55 | Resp 18 | Ht 71.0 in | Wt 186.0 lb

## 2012-05-08 DIAGNOSIS — E782 Mixed hyperlipidemia: Secondary | ICD-10-CM

## 2012-05-08 DIAGNOSIS — I2510 Atherosclerotic heart disease of native coronary artery without angina pectoris: Secondary | ICD-10-CM

## 2012-05-08 NOTE — Assessment & Plan Note (Signed)
Continue aspirin but discontinue Plavix. Continue statin. Schedule Myoview for risk stratification.

## 2012-05-08 NOTE — Assessment & Plan Note (Signed)
Continue statin. Lipids and liver monitored by primary care. 

## 2012-05-08 NOTE — Progress Notes (Signed)
   HPI: 67 year old male previously followed by Dr. Juanda Chance for followup of coronary artery disease. cardiac catheterization in 2007 following a myocardial infarction showed significant disease in the mid circumflex and first marginal branch. There was also significant disease in the right coronary artery. He had a 40% LAD. He had 2 stents to the circumflex, 1 stent to the obtuse marginal and 3 overlapping stents to the right coronary artery. His ejection fraction was 45% with inferior basal hypokinesis. Abdominal ultrasound in September of 2010 showed no abdominal aortic aneurysm. 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 Last seen in September of 2011. Since then, the patient has dyspnea with more extreme activities but not with routine activities. It is relieved with rest. It is not associated with chest pain. There is no orthopnea, PND or pedal edema. There is no syncope or palpitations. There is no exertional chest pain.   Current Outpatient Prescriptions  Medication Sig Dispense Refill  . aspirin 81 MG tablet Take 81 mg by mouth daily.      Marland Kitchen atorvastatin (LIPITOR) 40 MG tablet Take 40 mg by mouth daily.      . clopidogrel (PLAVIX) 75 MG tablet Take 75 mg by mouth daily.      . enalapril (VASOTEC) 20 MG tablet Take 20 mg by mouth daily.      . metoprolol succinate (TOPROL-XL) 25 MG 24 hr tablet Take 25 mg by mouth daily.      . Multiple Vitamin (MULTIVITAMIN WITH MINERALS) TABS Take 1 tablet by mouth daily.         Past Medical History  Diagnosis Date  . Mixed hyperlipidemia   . CORONARY ATHEROSCLEROSIS NATIVE CORONARY ARTERY   . DIZZINESS     No past surgical history on file.  History   Social History  . Marital Status: Married    Spouse Name: N/A    Number of Children: N/A  . Years of Education: N/A   Occupational History  . Not on file.   Social History Main Topics  . Smoking status: Former Games developer  . Smokeless tobacco: Not on file  . Alcohol Use: Not on file    . Drug Use: Not on file  . Sexually Active: Not on file   Other Topics Concern  . Not on file   Social History Narrative  . No narrative on file    ROS: no fevers or chills, productive cough, hemoptysis, dysphasia, odynophagia, melena, hematochezia, dysuria, hematuria, rash, seizure activity, orthopnea, PND, pedal edema, claudication. Remaining systems are negative.  Physical Exam: Well-developed well-nourished in no acute distress.  Skin is warm and dry.  HEENT is normal.  Neck is supple.  Chest is clear to auscultation with normal expansion.  Cardiovascular exam is regular rate and rhythm.  Abdominal exam nontender or distended. No masses palpated. Extremities show no edema. 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 No ST changes.

## 2012-05-08 NOTE — Patient Instructions (Addendum)
Your physician wants you to follow-up in: ONE YEAR WITH DR Shelda Pal will receive a reminder letter in the mail two months in advance. If you don't receive a letter, please call our office to schedule the follow-up appointment.   Your physician has requested that you have en exercise stress myoview. For further information please visit [email or web address] Please follow instruction sheet, as given.   STOP PLAVIX

## 2012-05-12 ENCOUNTER — Encounter: Payer: Self-pay | Admitting: Cardiology

## 2012-05-13 ENCOUNTER — Ambulatory Visit (HOSPITAL_COMMUNITY): Payer: 59 | Attending: Cardiovascular Disease | Admitting: Radiology

## 2012-05-13 VITALS — BP 126/65 | Ht 71.0 in | Wt 182.0 lb

## 2012-05-13 DIAGNOSIS — Z8249 Family history of ischemic heart disease and other diseases of the circulatory system: Secondary | ICD-10-CM | POA: Insufficient documentation

## 2012-05-13 DIAGNOSIS — R0989 Other specified symptoms and signs involving the circulatory and respiratory systems: Secondary | ICD-10-CM | POA: Insufficient documentation

## 2012-05-13 DIAGNOSIS — R0602 Shortness of breath: Secondary | ICD-10-CM

## 2012-05-13 DIAGNOSIS — R0609 Other forms of dyspnea: Secondary | ICD-10-CM | POA: Insufficient documentation

## 2012-05-13 DIAGNOSIS — I2510 Atherosclerotic heart disease of native coronary artery without angina pectoris: Secondary | ICD-10-CM

## 2012-05-13 MED ORDER — TECHNETIUM TC 99M SESTAMIBI GENERIC - CARDIOLITE
30.0000 | Freq: Once | INTRAVENOUS | Status: AC | PRN
  Administered 2012-05-13: 30 via INTRAVENOUS

## 2012-05-13 MED ORDER — TECHNETIUM TC 99M SESTAMIBI GENERIC - CARDIOLITE
10.0000 | Freq: Once | INTRAVENOUS | Status: AC | PRN
  Administered 2012-05-13: 10 via INTRAVENOUS

## 2012-05-13 NOTE — Progress Notes (Signed)
[facility] SITE 3 NUCLEAR MED [address] [place], [place] [address] [phone]    Cardiology Nuclear Med Study  Grant Shea is a 66 y.o. male     MRN : [id number]     DOB: [birth year]  Procedure Date: 05/13/2012  Nuclear Med Background Indication for Stress Test:  Evaluation for Ischemia and Stent Patency History: 7/07 Heart Catheterization-multi vessel dz>multiple stents placed,LAD 40% residual,EF:45%;7/07 Myocardial Infarction;12/07 Myocardial Perfusion Study sm inferoseptal infarct with mild peri-infarct ischemia significant bowel activity EF:46%; 7/07 Stents CFx X2,OM x1, RCA x3; Cardiac Risk Factors: Family History - CAD, History of Smoking and Lipids  Symptoms:  DOE   Nuclear Pre-Procedure Caffeine/Decaff Intake:  None > 12 hrs NPO After: 6:00pm   Lungs:  clear O2 Sat: 96% on room air. IV 0.9% NS with Angio Cath:  20g  IV Site: R Antecubital x 1, tolerated well IV Started by:  Irean Hong, RN  Chest Size (in):  44 Cup Size: n/a  Height: 5\' 11"  (1.803 m)  Weight:  182 lb (82.555 kg)  BMI:  Body mass index is 25.38 kg/(m^2). Tech Comments:  Toprol held 24 hrs    Nuclear Med Study 1 or 2 day study: 1 day  Stress Test Type:  Stress  Reading MD: Charlton Haws, MD  Order Authorizing Provider:  Olga Millers, MD  Resting Radionuclide: Technetium 84m Sestamibi  Resting Radionuclide Dose: 11.0 mCi   Stress Radionuclide:  Technetium 50m Sestamibi  Stress Radionuclide Dose: 33.0 mCi           Stress Protocol Rest HR: 47 Stress HR: 142  Rest BP: 126/65 Stress BP: 160/74  Exercise Time (min): 8:45 METS: 10.10   Predicted Max HR: 156 bpm % Max HR: 91.03 bpm Rate Pressure Product: 01027    Dose of Adenosine (mg):  n/a Dose of Lexiscan: n/a mg  Dose of Atropine (mg): n/a Dose of Dobutamine: n/a mcg/kg/min (at max HR)  Stress Test Technologist: Frederick Peers, EMT-P  Nuclear Technologist:  Domenic Polite, CNMT     Rest Procedure:  Myocardial perfusion  imaging was performed at rest 45 minutes following the intravenous administration of Technetium 70m Sestamibi. Rest ECG: NSR - Normal EKG  Stress Procedure:  The patient exercised on the treadmill utilizing the Bruce Protocol for 8:45 minutes. The patient stopped due to SOB and denied any chest pain.  Technetium 81m Sestamibi was injected at peak exercise and myocardial perfusion imaging was performed after a brief delay. Stress ECG: No significant change from baseline ECG  QPS Raw Data Images:  Patient motion noted. Stress Images:  Decreased inferior uptake Rest Images:  Decreased inferior uptake Subtraction (SDS):  SDS 2 abnormal in inferior septum Transient Ischemic Dilatation (Normal <1.22):  0.74 Lung/Heart Ratio (Normal <0.45):  0.34  Quantitative Gated Spect Images QGS EDV:  112 ml QGS ESV:  56 ml  Impression Exercise Capacity:  Fair exercise capacity. BP Response:  Normal blood pressure response. Clinical Symptoms:  There is dyspnea. ECG Impression:  No significant ST segment change suggestive of ischemia. Comparison with Prior Nuclear Study: No images to compare  Overall Impression:  Low risk stress nuclear study. Small inferobasal wall infarct with mild peri infarct ischemia  There is considerable  Bowel artifact especially in the rest images that make intepretation of the inferior wall difficult  LV Ejection Fraction: 50%.  LV Wall Motion:  Abnormal septal motion   Charlton Haws

## 2012-12-01 DIAGNOSIS — Z79899 Other long term (current) drug therapy: Secondary | ICD-10-CM | POA: Diagnosis not present

## 2012-12-01 DIAGNOSIS — R059 Cough, unspecified: Secondary | ICD-10-CM | POA: Diagnosis not present

## 2012-12-01 DIAGNOSIS — I2510 Atherosclerotic heart disease of native coronary artery without angina pectoris: Secondary | ICD-10-CM | POA: Diagnosis not present

## 2012-12-01 DIAGNOSIS — R05 Cough: Secondary | ICD-10-CM | POA: Diagnosis not present

## 2012-12-01 DIAGNOSIS — Z1211 Encounter for screening for malignant neoplasm of colon: Secondary | ICD-10-CM | POA: Diagnosis not present

## 2012-12-01 DIAGNOSIS — R292 Abnormal reflex: Secondary | ICD-10-CM | POA: Diagnosis not present

## 2012-12-01 DIAGNOSIS — Z125 Encounter for screening for malignant neoplasm of prostate: Secondary | ICD-10-CM | POA: Diagnosis not present

## 2012-12-01 DIAGNOSIS — E7800 Pure hypercholesterolemia, unspecified: Secondary | ICD-10-CM | POA: Diagnosis not present

## 2012-12-08 DIAGNOSIS — Z79899 Other long term (current) drug therapy: Secondary | ICD-10-CM | POA: Diagnosis not present

## 2012-12-08 DIAGNOSIS — R897 Abnormal histological findings in specimens from other organs, systems and tissues: Secondary | ICD-10-CM | POA: Diagnosis not present

## 2012-12-11 ENCOUNTER — Telehealth: Payer: Self-pay | Admitting: Cardiology

## 2012-12-11 DIAGNOSIS — I723 Aneurysm of iliac artery: Secondary | ICD-10-CM

## 2012-12-11 NOTE — Telephone Encounter (Signed)
Pt went to pcp c/o nagging cough. pcp stopped enalapril due to cough Started him on losartan 25 mg daily. Pt will not switch meds till Dr Jens Som gives the ok. Wife was told he will not hear back till 8/25 or 8/26. Wife was ok with plan to hold med change till she hears back.

## 2012-12-11 NOTE — Telephone Encounter (Signed)
New Prob  Pt wife wants to speak with a nurse regarding a medication change that his PCP but him on.

## 2012-12-11 NOTE — Telephone Encounter (Signed)
Ok to change enalapril to cozaar. Schedule ultrasound to fu r iliac aneurysm Grant Shea

## 2012-12-15 ENCOUNTER — Telehealth: Payer: Self-pay | Admitting: Cardiology

## 2012-12-15 NOTE — Telephone Encounter (Signed)
Spoke with pt wife, aqccording to her the pt developed a cough on the enalapril and they want to change him to losartan. Pt given the okay to change, reassurance given we would use the same. Pt wife voiced understanding

## 2012-12-15 NOTE — Telephone Encounter (Signed)
New problem   Pt's PCP want to change pt's Enalapril to Losartan potassium 25mg . Pt want to speak to nurse before this happens. Please call pt.

## 2012-12-16 MED ORDER — LOSARTAN POTASSIUM 25 MG PO TABS: 25.0000 mg | ORAL_TABLET | Freq: Every day | ORAL | Status: DC

## 2012-12-16 NOTE — Telephone Encounter (Signed)
Left message for pt to call.

## 2012-12-17 NOTE — Telephone Encounter (Signed)
Spoke with pt wife, follow up u/s to assess iliac aneurysm scheduled.

## 2012-12-17 NOTE — Telephone Encounter (Signed)
F/up ° °Pt returning call °

## 2012-12-24 ENCOUNTER — Encounter (INDEPENDENT_AMBULATORY_CARE_PROVIDER_SITE_OTHER): Payer: Medicare Other

## 2012-12-24 DIAGNOSIS — I723 Aneurysm of iliac artery: Secondary | ICD-10-CM

## 2013-07-12 ENCOUNTER — Ambulatory Visit: Payer: Medicare Other | Admitting: Cardiology

## 2013-08-11 ENCOUNTER — Other Ambulatory Visit (HOSPITAL_COMMUNITY): Payer: Self-pay | Admitting: Cardiology

## 2013-08-11 DIAGNOSIS — I714 Abdominal aortic aneurysm, without rupture, unspecified: Secondary | ICD-10-CM

## 2013-08-16 ENCOUNTER — Ambulatory Visit (INDEPENDENT_AMBULATORY_CARE_PROVIDER_SITE_OTHER): Payer: Medicare Other | Admitting: Cardiology

## 2013-08-16 ENCOUNTER — Encounter: Payer: Self-pay | Admitting: Cardiology

## 2013-08-16 ENCOUNTER — Ambulatory Visit (HOSPITAL_COMMUNITY): Payer: Medicare Other | Attending: Internal Medicine | Admitting: Cardiology

## 2013-08-16 VITALS — BP 120/74 | HR 53 | Ht 71.0 in | Wt 206.4 lb

## 2013-08-16 DIAGNOSIS — I2510 Atherosclerotic heart disease of native coronary artery without angina pectoris: Secondary | ICD-10-CM | POA: Diagnosis not present

## 2013-08-16 DIAGNOSIS — E782 Mixed hyperlipidemia: Secondary | ICD-10-CM

## 2013-08-16 DIAGNOSIS — I714 Abdominal aortic aneurysm, without rupture, unspecified: Secondary | ICD-10-CM | POA: Insufficient documentation

## 2013-08-16 DIAGNOSIS — I700 Atherosclerosis of aorta: Secondary | ICD-10-CM | POA: Diagnosis not present

## 2013-08-16 DIAGNOSIS — I723 Aneurysm of iliac artery: Secondary | ICD-10-CM

## 2013-08-16 NOTE — Progress Notes (Signed)
      HPI: FU coronary artery disease. Cardiac catheterization in 2007 following a myocardial infarction showed significant disease in the mid circumflex and first marginal branch. There was also significant disease in the right coronary artery. He had a 40% LAD. He had 2 stents to the circumflex, 1 stent to the obtuse marginal and 3 overlapping stents to the right coronary artery. His ejection fraction was 45% with inferior basal hypokinesis. Abdominal ultrasound in September of 2014 showed no abdominal aortic aneurysm. 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 FU recommended in six months. Nuclear study 1/14 showed EF 50; inferobasal infarct with mild peri-infarct ischemia. Last seen in Jan 2014. Since then, the patient has dyspnea with more extreme activities but not with routine activities. It is relieved with rest. It is not associated with chest pain. There is no orthopnea, PND or pedal edema. There is no syncope or palpitations. There is no exertional chest pain.   Current Outpatient Prescriptions  Medication Sig Dispense Refill  . aspirin 81 MG tablet Take 81 mg by mouth daily.      Marland Kitchen. atorvastatin (LIPITOR) 40 MG tablet Take 40 mg by mouth daily.      Marland Kitchen. losartan (COZAAR) 25 MG tablet Take 1 tablet (25 mg total) by mouth daily.  90 tablet  3  . metoprolol succinate (TOPROL-XL) 25 MG 24 hr tablet Take 25 mg by mouth daily.      . Multiple Vitamin (MULTIVITAMIN WITH MINERALS) TABS Take 1 tablet by mouth daily.       No current facility-administered medications for this visit.     Past Medical History  Diagnosis Date  . Mixed hyperlipidemia   . CORONARY ATHEROSCLEROSIS NATIVE CORONARY ARTERY   . DIZZINESS     History reviewed. No pertinent past surgical history.  History   Social History  . Marital Status: Married    Spouse Name: N/A    Number of Children: N/A  . Years of Education: N/A   Occupational History  . Not on file.   Social History Main Topics  .  Smoking status: Former Games developermoker  . Smokeless tobacco: Not on file  . Alcohol Use: Yes     Comment: ocassionally  . Drug Use: No  . Sexual Activity: Not on file   Other Topics Concern  . Not on file   Social History Narrative  . No narrative on file    ROS: no fevers or chills, productive cough, hemoptysis, dysphasia, odynophagia, melena, hematochezia, dysuria, hematuria, rash, seizure activity, orthopnea, PND, pedal edema, claudication. Remaining systems are negative.  Physical Exam: Well-developed well-nourished in no acute distress.  Skin is warm and dry.  HEENT is normal.  Neck is supple.  Chest is clear to auscultation with normal expansion.  Cardiovascular exam is regular rate and rhythm.  Abdominal exam nontender or distended. No masses palpated. Extremities show no edema. neuro grossly intact  ECG Sinus rhythm with occasional PAC. No ST changes.

## 2013-08-16 NOTE — Progress Notes (Signed)
Aorta duplex performed. 

## 2013-08-16 NOTE — Patient Instructions (Signed)
Your physician wants you to follow-up in: ONE YEAR WITH DR CRENSHAW You will receive a reminder letter in the mail two months in advance. If you don't receive a letter, please call our office to schedule the follow-up appointment.  

## 2013-08-16 NOTE — Assessment & Plan Note (Signed)
Continue aspirin and statin. Last nuclear study low risk.

## 2013-08-16 NOTE — Assessment & Plan Note (Signed)
Continue statin. 

## 2013-08-16 NOTE — Assessment & Plan Note (Signed)
Plan followup Dopplers today. May need referral to vascular surgery pending size.

## 2014-01-07 DIAGNOSIS — Z79899 Other long term (current) drug therapy: Secondary | ICD-10-CM | POA: Diagnosis not present

## 2014-01-07 DIAGNOSIS — Z23 Encounter for immunization: Secondary | ICD-10-CM | POA: Diagnosis not present

## 2014-01-07 DIAGNOSIS — E7800 Pure hypercholesterolemia, unspecified: Secondary | ICD-10-CM | POA: Diagnosis not present

## 2014-01-07 DIAGNOSIS — R350 Frequency of micturition: Secondary | ICD-10-CM | POA: Diagnosis not present

## 2014-01-07 DIAGNOSIS — I2510 Atherosclerotic heart disease of native coronary artery without angina pectoris: Secondary | ICD-10-CM | POA: Diagnosis not present

## 2014-01-07 DIAGNOSIS — Z0000 Encounter for general adult medical examination without abnormal findings: Secondary | ICD-10-CM | POA: Diagnosis not present

## 2014-01-07 DIAGNOSIS — R748 Abnormal levels of other serum enzymes: Secondary | ICD-10-CM | POA: Diagnosis not present

## 2014-01-07 DIAGNOSIS — J069 Acute upper respiratory infection, unspecified: Secondary | ICD-10-CM | POA: Diagnosis not present

## 2014-02-15 ENCOUNTER — Telehealth: Payer: Self-pay | Admitting: Neurology

## 2014-02-15 ENCOUNTER — Ambulatory Visit: Payer: Medicare Other | Admitting: Neurology

## 2014-02-15 NOTE — Telephone Encounter (Signed)
Dr. Paulino RilyWolters, referring provider was notified that pt did not want to make an NP appt. W/ Dr. Karel JarvisAquino.

## 2014-08-15 NOTE — Progress Notes (Signed)
      HPI: FU coronary artery disease. Cardiac catheterization in 2007 following a myocardial infarction showed significant disease in the mid circumflex and first marginal branch. There was also significant disease in the right coronary artery. He had a 40% LAD. He had 2 stents to the circumflex, 1 stent to the obtuse marginal and 3 overlapping stents to the right coronary artery. His ejection fraction was 45% with inferior basal hypokinesis. Nuclear study 1/14 showed EF 50; inferobasal infarct with mild peri-infarct ischemia. Abdominal ultrasound in April 2015 showed no abdominal aortic aneurysm. The right common iliac artery had a 2.5 x 2.6 cm aneurysm. FU recommended in 12 months. Since last seen, the patient denies any dyspnea on exertion, orthopnea, PND, pedal edema, palpitations, syncope or chest pain.   Current Outpatient Prescriptions  Medication Sig Dispense Refill  . aspirin 81 MG tablet Take 81 mg by mouth daily.    Marland Kitchen. atorvastatin (LIPITOR) 40 MG tablet Take 40 mg by mouth daily.    Marland Kitchen. losartan (COZAAR) 25 MG tablet Take 1 tablet (25 mg total) by mouth daily. 90 tablet 3  . metoprolol succinate (TOPROL-XL) 25 MG 24 hr tablet Take 25 mg by mouth daily.    . Multiple Vitamin (MULTIVITAMIN WITH MINERALS) TABS Take 1 tablet by mouth daily.     No current facility-administered medications for this visit.     Past Medical History  Diagnosis Date  . Mixed hyperlipidemia   . CORONARY ATHEROSCLEROSIS NATIVE CORONARY ARTERY   . DIZZINESS     History reviewed. No pertinent past surgical history.  History   Social History  . Marital Status: Married    Spouse Name: N/A  . Number of Children: N/A  . Years of Education: N/A   Occupational History  . Not on file.   Social History Main Topics  . Smoking status: Former Games developermoker  . Smokeless tobacco: Not on file  . Alcohol Use: Yes     Comment: ocassionally  . Drug Use: No  . Sexual Activity: Not on file   Other Topics Concern    . Not on file   Social History Narrative    ROS: arthralgias but no fevers or chills, productive cough, hemoptysis, dysphasia, odynophagia, melena, hematochezia, dysuria, hematuria, rash, seizure activity, orthopnea, PND, pedal edema, claudication. Remaining systems are negative.  Physical Exam: Well-developed well-nourished in no acute distress.  Skin is warm and dry.  HEENT is normal.  Neck is supple.  Chest is clear to auscultation with normal expansion.  Cardiovascular exam is regular rate and rhythm.  Abdominal exam nontender or distended. No masses palpated. Extremities show no edema. neuro grossly intact  ECG sinus bradycardia at a rate of 47. First degree AV block.

## 2014-08-17 ENCOUNTER — Encounter: Payer: Self-pay | Admitting: Cardiology

## 2014-08-17 ENCOUNTER — Ambulatory Visit (INDEPENDENT_AMBULATORY_CARE_PROVIDER_SITE_OTHER): Payer: Medicare HMO | Admitting: Cardiology

## 2014-08-17 VITALS — BP 120/84 | HR 47 | Ht 71.5 in | Wt 207.0 lb

## 2014-08-17 DIAGNOSIS — I2510 Atherosclerotic heart disease of native coronary artery without angina pectoris: Secondary | ICD-10-CM | POA: Diagnosis not present

## 2014-08-17 DIAGNOSIS — I723 Aneurysm of iliac artery: Secondary | ICD-10-CM

## 2014-08-17 DIAGNOSIS — E782 Mixed hyperlipidemia: Secondary | ICD-10-CM | POA: Diagnosis not present

## 2014-08-17 MED ORDER — METOPROLOL SUCCINATE ER 25 MG PO TB24: 12.5000 mg | ORAL_TABLET | Freq: Every day | ORAL | Status: DC

## 2014-08-17 NOTE — Assessment & Plan Note (Signed)
Continue statin. Lipids and liver monitored by primary care. 

## 2014-08-17 NOTE — Assessment & Plan Note (Signed)
Schedule follow-up ultrasound. 

## 2014-08-17 NOTE — Assessment & Plan Note (Signed)
Continue aspirin and statin.heart rate mildly decreased. Decrease Toprol to 12.5 mg daily.

## 2014-08-17 NOTE — Patient Instructions (Signed)
Your physician wants you to follow-up in: ONE YEAR WITH DR Shelda PalRENSHAW You will receive a reminder letter in the mail two months in advance. If you don't receive a letter, please call our office to schedule the follow-up appointment.   Your physician has requested that you have an abdominal aorta duplex. During this test, an ultrasound is used to evaluate the aorta. Allow 30 minutes for this exam. Do not eat after midnight the day before and avoid carbonated beverages   DECREASE METOPROLOL TO 12.5 MG ONCE DAILY= 1/2 OF 25 MG TABLET ONCE DAILY

## 2014-08-31 ENCOUNTER — Ambulatory Visit (HOSPITAL_COMMUNITY)
Admission: RE | Admit: 2014-08-31 | Discharge: 2014-08-31 | Disposition: A | Discharge status: Home / Self Care | Payer: Medicare HMO | Source: Ambulatory Visit | Attending: Internal Medicine | Admitting: Internal Medicine

## 2014-08-31 DIAGNOSIS — I723 Aneurysm of iliac artery: Secondary | ICD-10-CM | POA: Insufficient documentation

## 2014-09-16 ENCOUNTER — Other Ambulatory Visit: Payer: Self-pay

## 2014-09-16 DIAGNOSIS — I723 Aneurysm of iliac artery: Secondary | ICD-10-CM

## 2014-10-17 ENCOUNTER — Other Ambulatory Visit: Payer: Self-pay

## 2015-08-23 NOTE — Progress Notes (Signed)
      HPI: FU coronary artery disease. Cardiac catheterization in 2007 following a myocardial infarction showed significant disease in the mid circumflex and first marginal branch. There was also significant disease in the right coronary artery. He had a 40% LAD. He had 2 stents to the circumflex, 1 stent to the obtuse marginal and 3 overlapping stents to the right coronary artery. His ejection fraction was 45% with inferior basal hypokinesis. Nuclear study 1/14 showed EF 50; inferobasal infarct with mild peri-infarct ischemia. Abdominal ultrasound in May 2016 showed no abdominal aortic aneurysm. 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 FU recommended in 12 months. Since last seen, He denies dyspnea on exertion. No orthopnea, PND, pedal edema or syncope. He has an occasional sharp chest pain but no exertional symptoms.  Current Outpatient Prescriptions  Medication Sig Dispense Refill  . aspirin 81 MG tablet Take 81 mg by mouth daily.    Marland Kitchen. atorvastatin (LIPITOR) 40 MG tablet Take 40 mg by mouth daily.    Marland Kitchen. losartan (COZAAR) 25 MG tablet Take 1 tablet (25 mg total) by mouth daily. 90 tablet 3  . metoprolol succinate (TOPROL-XL) 25 MG 24 hr tablet Take 0.5 tablets (12.5 mg total) by mouth daily. 90 tablet 3  . Multiple Vitamin (MULTIVITAMIN WITH MINERALS) TABS Take 1 tablet by mouth daily.     No current facility-administered medications for this visit.     Past Medical History  Diagnosis Date  . Mixed hyperlipidemia   . CORONARY ATHEROSCLEROSIS NATIVE CORONARY ARTERY   . DIZZINESS     Past Surgical History  Procedure Laterality Date  . No past surgeries      Social History   Social History  . Marital Status: Married    Spouse Name: N/A  . Number of Children: N/A  . Years of Education: N/A   Occupational History  . Not on file.   Social History Main Topics  . Smoking status: Former Games developermoker  . Smokeless tobacco: Not on file  . Alcohol Use: Yes     Comment:  ocassionally  . Drug Use: No  . Sexual Activity: Not on file   Other Topics Concern  . Not on file   Social History Narrative    History reviewed. No pertinent family history.  ROS: no fevers or chills, productive cough, hemoptysis, dysphasia, odynophagia, melena, hematochezia, dysuria, hematuria, rash, seizure activity, orthopnea, PND, pedal edema, claudication. Remaining systems are negative.  Physical Exam: Well-developed well-nourished in no acute distress.  Skin is warm and dry.  HEENT is normal.  Neck is supple.  Chest is clear to auscultation with normal expansion.  Cardiovascular exam is regular rate and rhythm.  Abdominal exam nontender or distended. No masses palpated. Extremities show no edema. neuro grossly intact  Electrocardiogram shows sinus bradycardia with PACs. No ST changes.

## 2015-08-28 ENCOUNTER — Encounter: Payer: Self-pay | Admitting: Cardiology

## 2015-08-28 ENCOUNTER — Ambulatory Visit (INDEPENDENT_AMBULATORY_CARE_PROVIDER_SITE_OTHER): Payer: Medicare HMO | Admitting: Cardiology

## 2015-08-28 ENCOUNTER — Ambulatory Visit (HOSPITAL_COMMUNITY)
Admission: RE | Admit: 2015-08-28 | Discharge: 2015-08-28 | Disposition: A | Discharge status: Home / Self Care | Payer: Medicare HMO | Source: Ambulatory Visit | Attending: Cardiology | Admitting: Cardiology

## 2015-08-28 VITALS — BP 138/72 | HR 57 | Ht 72.0 in | Wt 205.0 lb

## 2015-08-28 DIAGNOSIS — E782 Mixed hyperlipidemia: Secondary | ICD-10-CM | POA: Diagnosis not present

## 2015-08-28 DIAGNOSIS — I723 Aneurysm of iliac artery: Secondary | ICD-10-CM

## 2015-08-28 DIAGNOSIS — I2510 Atherosclerotic heart disease of native coronary artery without angina pectoris: Secondary | ICD-10-CM | POA: Diagnosis not present

## 2015-08-28 DIAGNOSIS — I714 Abdominal aortic aneurysm, without rupture: Secondary | ICD-10-CM | POA: Insufficient documentation

## 2015-08-28 DIAGNOSIS — I708 Atherosclerosis of other arteries: Secondary | ICD-10-CM | POA: Insufficient documentation

## 2015-08-28 DIAGNOSIS — I700 Atherosclerosis of aorta: Secondary | ICD-10-CM | POA: Diagnosis not present

## 2015-08-28 MED ORDER — ATORVASTATIN CALCIUM 80 MG PO TABS: 80.0000 mg | ORAL_TABLET | Freq: Every day | ORAL | Status: DC

## 2015-08-28 NOTE — Assessment & Plan Note (Signed)
Given documented coronary artery disease increase Lipitor to 80 mg daily. Check lipids and liver in 4 weeks. 

## 2015-08-28 NOTE — Assessment & Plan Note (Signed)
Schedule follow-up ultrasound. 

## 2015-08-28 NOTE — Assessment & Plan Note (Addendum)
Continue aspirin, statin, beta blocker and ARB. Occasional sharp chest pain that does not sound cardiac. Plan nuclear study for risk stratification.

## 2015-08-28 NOTE — Patient Instructions (Addendum)
Medication Instructions:   INCREASE ATORVASTATIN TO 80 MG ONCE DAILY= 2 OF THE 40 MG TABLETS ONCE DAILY  Labwork:  Your physician recommends that you return for lab work in: 4 WEEKS = DO NOT EAT PRIOR TO LAB WORK  Testing/Procedures:  Your physician has requested that you have en exercise stress myoview. For further information please visit [email or web address]. Please follow instruction sheet, as given.   Follow-Up:  Your physician wants you to follow-up in: ONE YEAR WITH DR Shelda PalRENSHAW You will receive a reminder letter in the mail two months in advance. If you don't receive a letter, please call our office to schedule the follow-up appointment.   If you need a refill on your cardiac medications before your next appointment, please call your pharmacy.

## 2015-09-07 ENCOUNTER — Telehealth (HOSPITAL_COMMUNITY): Payer: Self-pay

## 2015-09-07 NOTE — Telephone Encounter (Signed)
Encounter complete. 

## 2015-09-12 ENCOUNTER — Telehealth (HOSPITAL_COMMUNITY): Payer: Self-pay

## 2015-09-12 ENCOUNTER — Ambulatory Visit (HOSPITAL_COMMUNITY)
Admission: RE | Admit: 2015-09-12 | Discharge: 2015-09-12 | Disposition: A | Discharge status: Home / Self Care | Payer: Medicare HMO | Source: Ambulatory Visit | Attending: Cardiology | Admitting: Cardiology

## 2015-09-12 DIAGNOSIS — Z8249 Family history of ischemic heart disease and other diseases of the circulatory system: Secondary | ICD-10-CM | POA: Insufficient documentation

## 2015-09-12 DIAGNOSIS — R9439 Abnormal result of other cardiovascular function study: Secondary | ICD-10-CM | POA: Diagnosis not present

## 2015-09-12 DIAGNOSIS — I739 Peripheral vascular disease, unspecified: Secondary | ICD-10-CM | POA: Insufficient documentation

## 2015-09-12 DIAGNOSIS — I252 Old myocardial infarction: Secondary | ICD-10-CM | POA: Insufficient documentation

## 2015-09-12 DIAGNOSIS — R079 Chest pain, unspecified: Secondary | ICD-10-CM | POA: Diagnosis not present

## 2015-09-12 DIAGNOSIS — Z87891 Personal history of nicotine dependence: Secondary | ICD-10-CM | POA: Insufficient documentation

## 2015-09-12 DIAGNOSIS — Z955 Presence of coronary angioplasty implant and graft: Secondary | ICD-10-CM | POA: Diagnosis not present

## 2015-09-12 DIAGNOSIS — I2510 Atherosclerotic heart disease of native coronary artery without angina pectoris: Secondary | ICD-10-CM | POA: Diagnosis not present

## 2015-09-12 LAB — MYOCARDIAL PERFUSION IMAGING
Estimated workload: 9.9 METS
Exercise duration (min): 8 min
LV dias vol: 118 mL (ref 62–150)
LV sys vol: 60 mL
MPHR: 153 {beats}/min
Peak HR: 133 {beats}/min
Percent HR: 86 %
RPE: 16
Rest HR: 48 {beats}/min
SDS: 5
SRS: 5
SSS: 8
TID: 0.91

## 2015-09-12 MED ORDER — TECHNETIUM TC 99M TETROFOSMIN IV KIT
29.4000 | PACK | Freq: Once | INTRAVENOUS | Status: AC | PRN
  Administered 2015-09-12: 29.4 via INTRAVENOUS
  Filled 2015-09-12: qty 29

## 2015-09-12 MED ORDER — TECHNETIUM TC 99M TETROFOSMIN IV KIT
10.3000 | PACK | Freq: Once | INTRAVENOUS | Status: AC | PRN
  Administered 2015-09-12: 10.3 via INTRAVENOUS
  Filled 2015-09-12: qty 10

## 2015-09-12 NOTE — Telephone Encounter (Signed)
No phone call encounter. This was accessed in error when I was logging pt information in for his NUC MPI .

## 2015-09-14 ENCOUNTER — Telehealth: Payer: Self-pay | Admitting: Cardiology

## 2015-09-14 NOTE — Telephone Encounter (Signed)
New message    Pt wife is calling she wants to speak to rn about pt and rn previous conversation    please call

## 2015-09-21 ENCOUNTER — Ambulatory Visit (INDEPENDENT_AMBULATORY_CARE_PROVIDER_SITE_OTHER): Payer: Medicare HMO | Admitting: Physician Assistant

## 2015-09-21 ENCOUNTER — Encounter: Payer: Self-pay | Admitting: Physician Assistant

## 2015-09-21 VITALS — BP 138/82 | HR 58 | Ht 72.0 in | Wt 202.6 lb

## 2015-09-21 DIAGNOSIS — I2510 Atherosclerotic heart disease of native coronary artery without angina pectoris: Secondary | ICD-10-CM | POA: Diagnosis not present

## 2015-09-21 MED ORDER — NITROGLYCERIN 0.4 MG SL SUBL: 0.4000 mg | SUBLINGUAL_TABLET | SUBLINGUAL | Status: AC | PRN

## 2015-09-21 NOTE — Patient Instructions (Signed)
Grant Barrett, PA-C, recommends that you schedule a follow-up appointment in 6 months with Dr Jens Somrenshaw. You will receive a reminder letter in the mail two months in advance. If you don't receive a letter, please call our office to schedule the follow-up appointment.  If you need a refill on your cardiac medications before your next appointment, please call your pharmacy.  Nitroglycerin sublingual tablets What is this medicine? NITROGLYCERIN (nye troe GLI ser in) is a type of vasodilator. It relaxes blood vessels, increasing the blood and oxygen supply to your heart. This medicine is used to relieve chest pain caused by angina. It is also used to prevent chest pain before activities like climbing stairs, going outdoors in cold weather, or sexual activity. This medicine may be used for other purposes; ask your health care provider or pharmacist if you have questions. What should I tell my health care provider before I take this medicine? They need to know if you have any of these conditions: -anemia -head injury, recent stroke, or bleeding in the brain -liver disease -previous heart attack -an unusual or allergic reaction to nitroglycerin, other medicines, foods, dyes, or preservatives -pregnant or trying to get pregnant -breast-feeding How should I use this medicine? Take this medicine by mouth as needed. At the first sign of an angina attack (chest pain or tightness) place one tablet under your tongue. You can also take this medicine 5 to 10 minutes before an event likely to produce chest pain. Follow the directions on the prescription label. Let the tablet dissolve under the tongue. Do not swallow whole. Replace the dose if you accidentally swallow it. It will help if your mouth is not dry. Saliva around the tablet will help it to dissolve more quickly. Do not eat or drink, smoke or chew tobacco while a tablet is dissolving. If you are not better within 5 minutes after taking ONE dose of  nitroglycerin, call 9-1-1 immediately to seek emergency medical care. Do not take more than 3 nitroglycerin tablets over 15 minutes. If you take this medicine often to relieve symptoms of angina, your doctor or health care professional may provide you with different instructions to manage your symptoms. If symptoms do not go away after following these instructions, it is important to call 9-1-1 immediately. Do not take more than 3 nitroglycerin tablets over 15 minutes. Talk to your pediatrician regarding the use of this medicine in children. Special care may be needed. Overdosage: If you think you have taken too much of this medicine contact a poison control center or emergency room at once. NOTE: This medicine is only for you. Do not share this medicine with others. What if I miss a dose? This does not apply. This medicine is only used as needed. What may interact with this medicine? Do not take this medicine with any of the following medications: -certain migraine medicines like ergotamine and dihydroergotamine (DHE) -medicines used to treat erectile dysfunction like sildenafil, tadalafil, and vardenafil -riociguat This medicine may also interact with the following medications: -alteplase -aspirin -heparin -medicines for high blood pressure -medicines for mental depression -other medicines used to treat angina -phenothiazines like chlorpromazine, mesoridazine, prochlorperazine, thioridazine This list may not describe all possible interactions. Give your health care provider a list of all the medicines, herbs, non-prescription drugs, or dietary supplements you use. Also tell them if you smoke, drink alcohol, or use illegal drugs. Some items may interact with your medicine. What should I watch for while using this medicine? Tell your doctor  or health care professional if you feel your medicine is no longer working. Keep this medicine with you at all times. Sit or lie down when you take your  medicine to prevent falling if you feel dizzy or faint after using it. Try to remain calm. This will help you to feel better faster. If you feel dizzy, take several deep breaths and lie down with your feet propped up, or bend forward with your head resting between your knees. You may get drowsy or dizzy. Do not drive, use machinery, or do anything that needs mental alertness until you know how this drug affects you. Do not stand or sit up quickly, especially if you are an older patient. This reduces the risk of dizzy or fainting spells. Alcohol can make you more drowsy and dizzy. Avoid alcoholic drinks. Do not treat yourself for coughs, colds, or pain while you are taking this medicine without asking your doctor or health care professional for advice. Some ingredients may increase your blood pressure. What side effects may I notice from receiving this medicine? Side effects that you should report to your doctor or health care professional as soon as possible: -blurred vision -dry mouth -skin rash -sweating -the feeling of extreme pressure in the head -unusually weak or tired Side effects that usually do not require medical attention (report to your doctor or health care professional if they continue or are bothersome): -flushing of the face or neck -headache -irregular heartbeat, palpitations -nausea, vomiting This list may not describe all possible side effects. Call your doctor for medical advice about side effects. You may report side effects to FDA at 1-800-FDA-1088. Where should I keep my medicine? Keep out of the reach of children. Store at room temperature between 20 and 25 degrees C (68 and 77 degrees F). Store in Retail buyer. Protect from light and moisture. Keep tightly closed. Throw away any unused medicine after the expiration date. NOTE: This sheet is a summary. It may not cover all possible information. If you have questions about this medicine, talk to your doctor,  pharmacist, or health care provider.    2016, Elsevier/Gold Standard. (2013-02-04 17:57:36)

## 2015-09-21 NOTE — Progress Notes (Signed)
Cardiology Office Note   Date:  09/21/2015   ID:  Grant Shea, DOB 01-07-1946, MRN [id number]  PCP:  Emeterio Reeve, MD  Cardiologist:  Dr Rosemarie Beath, PA-C   No chief complaint on file.   History of Present Illness: Grant Shea is a 70 y.o. 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  Seen by Dr Jens Som 05/03 and MV ordered. It was abnl, pt here to discuss. He is here today with his wife.  Grant Shea presents for follow-up of his Myoview.  Grant Shea does very well. He is active around Grant house and yard, and walks for exercise. He never gets exertional chest pain.   He gets occasional stabbing pains that are very brief, and not clearly exertional. He is not getting these very often. He did not have any of them when he was doing Grant treadmill portion of stress test. He became short of breath with this.   When he is doing something that is a much higher level of exertion than usual such as carrying something heavy upstairs, he will get short of breath but has not had chest pain. This happens very rarely.   He does not feel limited by Grant shortness of breath at all. He can do everything he wants to do. He admits that he should probably be less pork, but feels he generally does pretty well from a dietary standpoint. He is not having lower extremity edema, orthopnea or PND. He is fatalistic, saying all Grant men in his family die at age 65. However, he believes his general health is good.   Past Medical History  Diagnosis Date  . Mixed hyperlipidemia   . CORONARY ATHEROSCLEROSIS NATIVE CORONARY ARTERY   . DIZZINESS     Past Surgical History  Procedure Laterality Date  . Coronary angioplasty with stent placement  11/01/2015    LAD 40%, CFX 95%>0% w/ overlapping Taxus 2.5 x 24 mm, 2.5 x 12 mm DES, OM 90%>0% w/ Taxus 2.5 x 16 mm DES, RCA 95%>0% w/ overlapping Taxus 2.5 x 24  mm x 2 and a 2.75 x 32 mm DES, EF 45%        Current Outpatient Prescriptions  Medication Sig Dispense Refill  . aspirin 81 MG tablet Take 81 mg by mouth daily.    Marland Kitchen atorvastatin (LIPITOR) 80 MG tablet Take 1 tablet (80 mg total) by mouth daily. 90 tablet 3  . losartan (COZAAR) 25 MG tablet Take 1 tablet (25 mg total) by mouth daily. 90 tablet 3  . metoprolol succinate (TOPROL-XL) 25 MG 24 hr tablet Take 0.5 tablets (12.5 mg total) by mouth daily. 90 tablet 3  . Multiple Vitamin (MULTIVITAMIN WITH MINERALS) TABS Take 1 tablet by mouth daily.     No current facility-administered medications for this visit.    Allergies:   Penicillins    Social History:  Grant Shea  reports that he has quit smoking. He does not have any smokeless tobacco history on file. He reports that he drinks alcohol. He reports that he does not use illicit drugs.   Family History:  Grant Shea's family history includes Coronary artery disease in his father.    ROS:  Please see Grant history of present illness. All other systems are reviewed and negative.    PHYSICAL EXAM: VS:  BP 138/82 mmHg  Pulse 58  Ht 6' (1.829  m)  Wt 202 lb 9.6 oz (91.899 kg)  BMI 27.47 kg/m2 , BMI Body mass index is 27.47 kg/(m^2). GEN: Well nourished, well developed, male in no acute distress HEENT: normal for age  Neck: no JVD, no carotid bruit, no masses Cardiac: RRR; soft murmur, no rubs, or gallops Respiratory:  clear to auscultation bilaterally, normal work of breathing GI: soft, nontender, nondistended, + BS MS: no deformity or atrophy; no edema; distal pulses are 2+ in all 4 extremities  Skin: warm and dry, no rash Neuro:  Strength and sensation are intact Psych: euthymic mood, full affect   EKG:  EKG is not ordered today.  MV: 09/12/2015  Grant left ventricular ejection fraction is mildly decreased (45-54%).  Nuclear stress EF: 49%.  There was no ST segment deviation noted during stress. Grant Shea did develop  frequent PVCs and aberrant conduction at peak exercise.  Defect 1: There is a medium defect of moderate severity present in Grant basal inferior, mid inferior and apical inferior location.  Findings consistent with prior Inferior wall myocardial infarction with peri-infarct ischemia in Grant apical inferior region.  This is an intermediate risk study.  MV: 05/13/2012 Overall Impression: Low risk stress nuclear study. Small inferobasal wall infarct with mild peri infarct ischemia There is considerable bowel artifact especially in Grant rest images that make intepretation of  Grant inferior wall difficult LV Ejection Fraction: 50%. LV Wall Motion: Abnormal septal motion  CATH: 10/31/2005 RESULTS: There was no left main coronary artery since there were separate ostia of Grant LAD and circumflex artery.  Grant left anterior descending artery gave rise to three diagonal branches and a septal perforator. There was 40% narrowing after Grant first diagonal branch before Grant septal perforator.  Grant circumflex artery gave rise to an atrial branch, a marginal branch, and an AV branch which terminated into small posterolateral branches. There were tandem 95%, 80%, and 90% stenoses in Grant mid portion of Grant circumflex artery. There was a 90% stenosis in Grant large marginal branch.  Grant right coronary artery is a moderate-size vessel, gave rise to a right ventricle branch, a posterior descending branch, and two small posterolateral branches. There was 95% stenosis in Grant proximal vessel, tandem 70% stenoses in Grant mid vessel, and 90% stenosis in Grant distal vessel. Initially Grant flow was TIMI 3 flow, but prior to Grant intervention Grant flow had decreased to TIMI 1 despite Grant fact that there was no pain or ST elevation.  Grant left ventriculogram performed in Grant RAO projection showed inferobasal wall hypokinesis. Grant overall wall motion was slightly reduced with an estimated  fraction of 45%.  Following placement of tandem overlapping Taxus stents in Grant mid AV circumflex artery, Grant stenosis improved from 95% to 0%.  Following placement of a Taxus stent in Grant marginal branch of Grant circumflex artery, stenosis improved from 90% to 0%.  Following placement of three tandem overlapping Taxus stents in Grant proximal, mid and distal right coronary artery, Grant stenoses improved from 95% to 0%.  CONCLUSION: 1. Coronary artery status post recent non-ST-elevation myocardial  infarction with 40% narrowing in Grant proximal left anterior descending  coronary artery; 95%, 80% and 90% stenoses in Grant mid circumflex artery;  90% stenosis in Grant first marginal branch of Grant circumflex artery; 95%  proximal, 70% mid and 90% distal stenoses in Grant right coronary; and  inferobasal wall hypokinesis with an estimated fraction of 45%. 2. Successful stenting of Grant AV circumflex artery with using two  overlapping  Taxus stents with improvement in percent diameter narrowing  from 95% to 0%. 3. Successful percutaneous coronary intervention of Grant lesion in Grant  marginal branch of Grant circumflex artery using a Taxus drug-eluting  stent with improvement in percent diameter narrowing from 90% to 0%. 4. Successful percutaneous coronary intervention of Grant proximal, mid and  distal lesions in Grant right coronary  using three overlapping Taxus drug-eluting stents with improvement in  percent of diameter narrowing from 95% to 0%.  Recent Labs: No results found for requested labs within last 365 days.    Lipid Panel    Component Value Date/Time   CHOL 110 10/27/2006 0841   TRIG 141 10/27/2006 0841   HDL 32.0* 10/27/2006 0841   CHOLHDL 3.4 CALC 10/27/2006 0841   VLDL 28 10/27/2006 0841   LDLCALC 50 10/27/2006 0841     Wt Readings from Last 3 Encounters:  09/21/15 202 lb 9.6 oz (91.899 kg)  09/12/15 205 lb  (92.987 kg)  08/28/15 205 lb (92.987 kg)     Other studies Reviewed: Additional studies/ records that were reviewed today include: Previous cath report and stress tests as well as office notes and labs.  ASSESSMENT AND PLAN:  1.  CAD: He is not having any clearly ischemic symptoms. He was offered a cardiac catheterization versus medical therapy. Grant risks and benefits of Grant cardiac catheterization were discussed with Grant Shea and his wife. They understand that Grant scar was there previously but  At this time, he feels his activity level is good and he has no limitations. He does not wish to pursue cardiac catheterization at this time. He will have sublingual nitroglycerin added to his medication regimen to use when necessary.  He and his wife understand that we can schedule heart catheterization at any time. If he begins to have any ischemic symptoms, or reconsiders for any reason, he is to contact us and we will schedule Grant procedure.  2. Bradycardia: His heart rate was 47 during his previous office visit, currently it is 58. He is on a low dose of beta blocker. He is not having any symptoms attributable to Grant bradycardia. No medication changes for now. He and his wife understand that if he begins to have any problems with weakness or getting lightheaded, he is to contact us.   Current medicines are reviewed at length with Grant Shea today.  Grant Shea does not have concerns regarding medicines.  Grant following changes have been made:  Add sublingual nitroglycerin  Labs/ tests ordered today include:  None   Disposition:   FU with Dr. Jens Somrenshaw  Signed, Theodore DemarkBarrett, Rhonda, PA-C  09/21/2015 10:27 AM    [facility] Phone: [phone]; Fax: [phone]  This note was written with Grant assistance of speech recognition software. Please excuse any transcriptional errors.

## 2015-09-28 DIAGNOSIS — E782 Mixed hyperlipidemia: Secondary | ICD-10-CM | POA: Diagnosis not present

## 2015-09-29 LAB — LIPID PANEL
Cholesterol: 115 mg/dL — ABNORMAL LOW (ref 125–200)
HDL: 43 mg/dL (ref >=40)
LDL Cholesterol: 42 mg/dL (ref <130)
Total CHOL/HDL Ratio: 2.7 Ratio (ref <=5.0)
Triglycerides: 148 mg/dL (ref <150)
VLDL: 30 mg/dL (ref <30)

## 2015-09-29 LAB — HEPATIC FUNCTION PANEL
ALT: 37 U/L (ref 9–46)
AST: 29 U/L (ref 10–35)
Albumin: 4.1 g/dL (ref 3.6–5.1)
Alkaline Phosphatase: 114 U/L (ref 40–115)
Bilirubin, Direct: 0.1 mg/dL (ref <=0.2)
Indirect Bilirubin: 0.4 mg/dL (ref 0.2–1.2)
Total Bilirubin: 0.5 mg/dL (ref 0.2–1.2)
Total Protein: 6.6 g/dL (ref 6.1–8.1)

## 2015-10-02 ENCOUNTER — Encounter: Payer: Self-pay | Admitting: *Deleted

## 2015-11-01 HISTORY — PX: CORONARY ANGIOPLASTY WITH STENT PLACEMENT: SHX49

## 2015-11-14 ENCOUNTER — Other Ambulatory Visit: Payer: Self-pay | Admitting: Cardiology

## 2015-11-14 NOTE — Telephone Encounter (Signed)
REFILL 

## 2015-12-29 NOTE — Telephone Encounter (Signed)
Please close Encounter °

## 2016-02-02 DIAGNOSIS — Z79899 Other long term (current) drug therapy: Secondary | ICD-10-CM | POA: Diagnosis not present

## 2016-02-02 DIAGNOSIS — Z125 Encounter for screening for malignant neoplasm of prostate: Secondary | ICD-10-CM | POA: Diagnosis not present

## 2016-02-02 DIAGNOSIS — Z23 Encounter for immunization: Secondary | ICD-10-CM | POA: Diagnosis not present

## 2016-02-02 DIAGNOSIS — I722 Aneurysm of renal artery: Secondary | ICD-10-CM | POA: Diagnosis not present

## 2016-02-02 DIAGNOSIS — E7800 Pure hypercholesterolemia, unspecified: Secondary | ICD-10-CM | POA: Diagnosis not present

## 2016-02-02 DIAGNOSIS — Z1159 Encounter for screening for other viral diseases: Secondary | ICD-10-CM | POA: Diagnosis not present

## 2016-02-02 DIAGNOSIS — D692 Other nonthrombocytopenic purpura: Secondary | ICD-10-CM | POA: Diagnosis not present

## 2016-02-02 DIAGNOSIS — E039 Hypothyroidism, unspecified: Secondary | ICD-10-CM | POA: Diagnosis not present

## 2016-02-02 DIAGNOSIS — Z0000 Encounter for general adult medical examination without abnormal findings: Secondary | ICD-10-CM | POA: Diagnosis not present

## 2016-02-02 DIAGNOSIS — I2510 Atherosclerotic heart disease of native coronary artery without angina pectoris: Secondary | ICD-10-CM | POA: Diagnosis not present

## 2016-02-06 DIAGNOSIS — Z1211 Encounter for screening for malignant neoplasm of colon: Secondary | ICD-10-CM | POA: Diagnosis not present

## 2016-02-19 DIAGNOSIS — M2669 Other specified disorders of temporomandibular joint: Secondary | ICD-10-CM | POA: Diagnosis not present

## 2016-02-19 DIAGNOSIS — J329 Chronic sinusitis, unspecified: Secondary | ICD-10-CM | POA: Diagnosis not present

## 2016-06-25 ENCOUNTER — Ambulatory Visit (INDEPENDENT_AMBULATORY_CARE_PROVIDER_SITE_OTHER): Payer: Medicare HMO | Admitting: Nurse Practitioner

## 2016-06-25 ENCOUNTER — Encounter: Payer: Self-pay | Admitting: Nurse Practitioner

## 2016-06-25 VITALS — BP 130/70 | HR 52 | Ht 72.0 in | Wt 211.4 lb

## 2016-06-25 DIAGNOSIS — I2510 Atherosclerotic heart disease of native coronary artery without angina pectoris: Secondary | ICD-10-CM

## 2016-06-25 DIAGNOSIS — I714 Abdominal aortic aneurysm, without rupture, unspecified: Secondary | ICD-10-CM

## 2016-06-25 DIAGNOSIS — E785 Hyperlipidemia, unspecified: Secondary | ICD-10-CM | POA: Diagnosis not present

## 2016-06-25 DIAGNOSIS — I723 Aneurysm of iliac artery: Secondary | ICD-10-CM

## 2016-06-25 NOTE — Progress Notes (Signed)
Office Visit    Patient Name: Grant Shea Date of Encounter: 06/25/2016  Primary Care Provider:  Emeterio Reeve, MD Primary Cardiologist:  B. Jens Som, MD   Chief Complaint    71 year old male with a history of CAD status post MI and multivessel stenting 2007, abdominal aortic aneurysm, right common iliac aneurysm, and hyperlipidemia, who presents for evaluation as he needs to have some teeth pulled.  Past Medical History    Past Medical History:  Diagnosis Date  . AAA (abdominal aortic aneurysm) (HCC)    a.08/2015 Aortic Duplex: 2.2 x 2.9 cm Infrarenal fusiform AAA.  Marland Kitchen Coronary atherosclerosis of native coronary artery [I25.10]    a. 2007 NSTEMI/PCI: severe dzs in mLCX (DES x 2), OM1 (DES x1), and RCA (DES x 3 in overlapping fashion), 40% LAD;  b. 08/2015 Myoview: EF 49%, medium defect/moderate severity in basal inferior, mid inferior, and apical inferior locations->inf infarct w/ peri-infarct ischemia--pt doing well @ f/u--med Rx.  Marland Kitchen DIZZINESS   . Mixed hyperlipidemia   . Right Common iliac aneurysm (HCC)    a. 08/2015 Duplex: 2.5 x 2.2 cm R CIA aneurysm - stable.   Past Surgical History:  Procedure Laterality Date  . CORONARY ANGIOPLASTY WITH STENT PLACEMENT  11/01/2015   LAD 40%, CFX 95%>0% w/ overlapping Taxus 2.5 x 24 mm, 2.5 x 12 mm DES, OM 90%>0% w/ Taxus 2.5 x 16 mm DES, RCA 95%>0% w/ overlapping Taxus 2.5 x 24 mm x 2 and a 2.75 x 32 mm DES, EF 45%        Allergies  Allergies  Allergen Reactions  . Penicillins     Unknown     History of Present Illness    71 year old male with the above complex past medical history including non-STEMI in 2007 with catheterization revealing severe multivessel disease involving the circumflex, OM1, and RCA. Each vessel was successfully stented. He had residual 40% stenosis in the LAD. Other history includes hyperlipidemia, remote tobacco abuse, and abdominal and right common iliac aneurysms which were stable by duplex in May  2017. When he was last seen in May 2017, he reported occasional sharp chest pain. Stress test was undertaken and showed a medium defect of moderate severity involving the basal inferior, mid inferior, and apical inferior locations consistent with infarct. There was also peri-infarct ischemia. This was felt to be an intermediate study. He followed up in June 2017 and noted that he was doing well without chest pain or dyspnea and preferred ongoing medical therapy. Since his last visit, he is continues to do well without chest pain, dyspnea, PND, orthopnea, dizziness, syncope, edema, or early satiety. He has been having some tooth pain and will need to have 3 teeth pulled. He was just seen at the dentist yesterday. Because of his cardiac history, he was advised to obtain cardiac clearance. This will be done in the office under local anesthesia only. He has been compliant with his aspirin, beta blocker, and statin.  Home Medications    Prior to Admission medications   Medication Sig Start Date End Date Taking? Authorizing Provider  aspirin 81 MG tablet Take 81 mg by mouth daily.   Yes Historical Provider, MD  atorvastatin (LIPITOR) 80 MG tablet Take 1 tablet (80 mg total) by mouth daily. 08/28/15  Yes Lewayne Bunting, MD  losartan (COZAAR) 25 MG tablet Take 1 tablet (25 mg total) by mouth daily. 12/16/12  Yes Lewayne Bunting, MD  metoprolol succinate (TOPROL-XL) 25 MG 24 hr  tablet TAKE 1/2 TABLET BY MOUTH EVERY DAY 11/14/15  Yes Lewayne BuntingBrian S Crenshaw, MD  Multiple Vitamin (MULTIVITAMIN WITH MINERALS) TABS Take 1 tablet by mouth daily.   Yes Historical Provider, MD  nitroGLYCERIN (NITROSTAT) 0.4 MG SL tablet Place 1 tablet (0.4 mg total) under the tongue every 5 (five) minutes as needed for chest pain. 09/21/15  Yes Rhonda G Barrett, PA-C    Review of Systems    As above, other than tooth pain, he has been doing well without chest pain, dyspnea, PND, orthopnea, dizziness, syncope, edema, or early satiety.  All  other systems reviewed and are otherwise negative except as noted above.  Physical Exam    VS:  BP 130/70   Pulse (!) 52   Ht 6' (1.829 m)   Wt 211 lb 6.4 oz (95.9 kg)   BMI 28.67 kg/m  , BMI Body mass index is 28.67 kg/m. GEN: Well nourished, well developed, in no acute distress.  HEENT: normal.  Neck: Supple, no JVD, carotid bruits, or masses. Cardiac: RRR, no murmurs, rubs, or gallops. No clubbing, cyanosis, edema.  Radials/DP/PT 2+ and equal bilaterally.  Respiratory:  Respirations regular and unlabored, clear to auscultation bilaterally. GI: Soft, nontender, nondistended, BS + x 4. MS: no deformity or atrophy. Skin: warm and dry, no rash. Neuro:  Strength and sensation are intact. Psych: Normal affect.  Accessory Clinical Findings    ECG - Sinus bradycardia, 52, no acute ST or T changes.  Assessment & Plan    1.  Coronary artery disease: Status post non-STEMI in 2007 with stenting of the circumflex, first obtuse marginal, and right coronary artery. He has been doing well ever since then. He did have some fleeting chest pain last year and had a stress test which was an intermediate study showing prior inferior infarct with peri-infarct ischemia. On follow-up, he was doing well and therefore medical therapy was continued. He was not interested in catheterization. This continued to do well without chest pain, dyspnea, or changes in exercise tolerance. He will need to have some teeth pulled. This is to be done under local anesthesia only. I advised that he continue his aspirin and beta blocker and statin but he will not require any additional ischemic evaluation. If bleeding risk is felt to be high, he could hold his aspirin on the day of the procedure. We will communicate this with the dentist office.  2. 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 We will arrange for follow-up this coming May. Blood pressure is well controlled.  3.  Hyperlipidemia: LDL was 42 in June 2017. Continue high potency statin therapy.  4. Disposition:  F/u aortic duplex in May. F/u Dr. Jens Somrenshaw in June.  Nicolasa Duckinghristopher Airam Runions, NP 06/25/2016, 2:37 PM

## 2016-06-25 NOTE — Patient Instructions (Addendum)
Medication Instructions:  Your physician recommends that you continue on your current medications as directed. Please refer to the Current Medication list given to you today.  Labwork: NONE  Testing/Procedures: Your physician has requested that you have an abdominal aorta duplex in May. During this test, an ultrasound is used to evaluate the aorta. Allow 30 minutes for this exam. Do not eat after midnight the day before and avoid carbonated beverages  Follow-Up: Your physician wants you to follow-up in: June with Dr. Jens Somrenshaw . You will receive a reminder letter in the mail two months in advance. If you don't receive a letter, please call our office to schedule the follow-up appointment.   If you need a refill on your cardiac medications before your next appointment, please call your pharmacy.

## 2016-07-01 IMAGING — NM NM MISC PROCEDURE
6 series · 36 of 36 positions shown · non-contrast
Comparison: none

[Series 1: wbr_r-proj_st wbr rest · 6.40mm/px · 6 of 64 frames shown]
[frame 6/64]
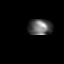
[frame 16/64]
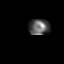
[frame 27/64]
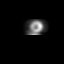
[frame 38/64]
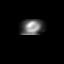
[frame 48/64]
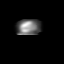
[frame 59/64]
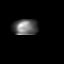

[Series 1: wbr rest · 6.40mm/px · 6 of 64 frames shown]
[frame 6/64]
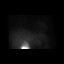
[frame 16/64]
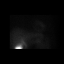
[frame 27/64]
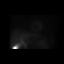
[frame 38/64]
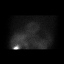
[frame 48/64]
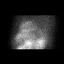
[frame 59/64]
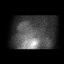

[Series 2: wbr stress-gsp · 6.40mm/px · 6 of 502 frames shown]
[frame 42/502]
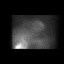
[frame 126/502]
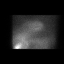
[frame 210/502]
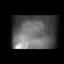
[frame 293/502]
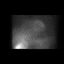
[frame 377/502]
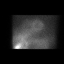
[frame 461/502]
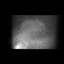

[Series 2: wbr_s-proj_st wbr stress-gsp · 6.40mm/px · 6 of 512 frames shown]
[frame 43/512]
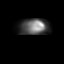
[frame 128/512]
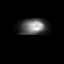
[frame 214/512]
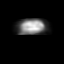
[frame 299/512]
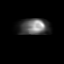
[frame 384/512]
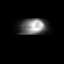
[frame 470/512]
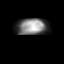

[Series 3: wbr_s-proj_st wbr stress-sum-em · 6.40mm/px · 6 of 64 frames shown]
[frame 6/64]
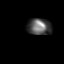
[frame 16/64]
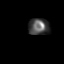
[frame 27/64]
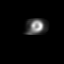
[frame 38/64]
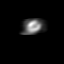
[frame 48/64]
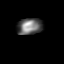
[frame 59/64]
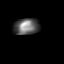

[Series 3: wbr stress-sum-em · 6.40mm/px · 6 of 64 frames shown]
[frame 6/64]
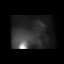
[frame 16/64]
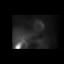
[frame 27/64]
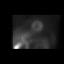
[frame 38/64]
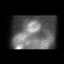
[frame 48/64]
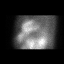
[frame 59/64]
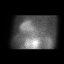

[36 of 36 positions shown; findings below may reference images not displayed]

Canned report from images found in remote index.

Refer to host system for actual result text.

## 2016-08-26 ENCOUNTER — Other Ambulatory Visit (HOSPITAL_COMMUNITY): Payer: Medicare HMO

## 2016-09-04 ENCOUNTER — Ambulatory Visit (HOSPITAL_COMMUNITY)
Admission: RE | Admit: 2016-09-04 | Discharge: 2016-09-04 | Disposition: A | Discharge status: Home / Self Care | Payer: Medicare HMO | Source: Ambulatory Visit | Attending: Cardiovascular Disease | Admitting: Cardiovascular Disease

## 2016-09-04 DIAGNOSIS — I723 Aneurysm of iliac artery: Secondary | ICD-10-CM | POA: Diagnosis not present

## 2016-10-10 ENCOUNTER — Other Ambulatory Visit: Payer: Self-pay | Admitting: Cardiology

## 2016-10-10 DIAGNOSIS — E782 Mixed hyperlipidemia: Secondary | ICD-10-CM

## 2016-10-10 NOTE — Progress Notes (Signed)
HPI: FU coronary artery disease. Cardiac catheterization in 2007 following a myocardial infarction showed significant disease in the mid circumflex and first marginal branch. There was also significant disease in the right coronary artery. He had a 40% LAD. He had 2 stents to the circumflex, 1 stent to the obtuse marginal and 3 overlapping stents to the right coronary artery. His ejection fraction was 45% with inferior basal hypokinesis. Nuclear study 5/17 showed EF 49; inferior infarct with peri-infarct ischemia towards the apex. Abd ultrasound 5/18 showed RCI 1.9 x 2 cm and LCI 1.4 x 1.4 cm; fu 1 year. Since last seen, the patient denies any dyspnea on exertion, orthopnea, PND, pedal edema, palpitations, syncope or chest pain.   Current Outpatient Prescriptions  Medication Sig Dispense Refill  . aspirin 81 MG tablet Take 81 mg by mouth daily.    Marland Kitchen. atorvastatin (LIPITOR) 80 MG tablet TAKE 1 TABLET DAILY 90 tablet 0  . losartan (COZAAR) 25 MG tablet Take 1 tablet (25 mg total) by mouth daily. 90 tablet 3  . metoprolol succinate (TOPROL-XL) 25 MG 24 hr tablet TAKE 1/2 TABLET BY MOUTH EVERY DAY 90 tablet 1  . nitroGLYCERIN (NITROSTAT) 0.4 MG SL tablet Place 1 tablet (0.4 mg total) under the tongue every 5 (five) minutes as needed for chest pain. 25 tablet 3   No current facility-administered medications for this visit.      Past Medical History:  Diagnosis Date  . AAA (abdominal aortic aneurysm) (HCC)    a.08/2015 Aortic Duplex: 2.2 x 2.9 cm Infrarenal fusiform AAA.  Marland Kitchen. Coronary atherosclerosis of native coronary artery [I25.10]    a. 2007 NSTEMI/PCI: severe dzs in mLCX (DES x 2), OM1 (DES x1), and RCA (DES x 3 in overlapping fashion), 40% LAD;  b. 08/2015 Myoview: EF 49%, medium defect/moderate severity in basal inferior, mid inferior, and apical inferior locations->inf infarct w/ peri-infarct ischemia--pt doing well @ f/u--med Rx.  Marland Kitchen. DIZZINESS   . Mixed hyperlipidemia   . Right Common  iliac aneurysm (HCC)    a. 08/2015 Duplex: 2.5 x 2.2 cm R CIA aneurysm - stable.    Past Surgical History:  Procedure Laterality Date  . CORONARY ANGIOPLASTY WITH STENT PLACEMENT  11/01/2015   LAD 40%, CFX 95%>0% w/ overlapping Taxus 2.5 x 24 mm, 2.5 x 12 mm DES, OM 90%>0% w/ Taxus 2.5 x 16 mm DES, RCA 95%>0% w/ overlapping Taxus 2.5 x 24 mm x 2 and a 2.75 x 32 mm DES, EF 45%        Social History   Social History  . Marital status: Married    Spouse name: N/A  . Number of children: N/A  . Years of education: N/A   Occupational History  . Not on file.   Social History Main Topics  . Smoking status: Former Games developermoker  . Smokeless tobacco: Never Used  . Alcohol use Yes     Comment: ocassionally  . Drug use: No  . Sexual activity: Not on file   Other Topics Concern  . Not on file   Social History Narrative  . No narrative on file    Family History  Problem Relation Age of Onset  . Coronary artery disease Father        Multiple male relatives    ROS: no fevers or chills, productive cough, hemoptysis, dysphasia, odynophagia, melena, hematochezia, dysuria, hematuria, rash, seizure activity, orthopnea, PND, pedal edema, claudication. Remaining systems are negative.  Physical Exam: Well-developed well-nourished in  no acute distress.  Skin is warm and dry.  HEENT is normal.  Neck is supple.  Chest is clear to auscultation with normal expansion.  Cardiovascular exam is regular rate and rhythm.  Abdominal exam nontender or distended. No masses palpated. Extremities show no edema. neuro grossly intact   A/P  1 Coronary artery disease-continue aspirin and statin.  2 Iliac aneurysms-plan follow-up ultrasound May 2019.  3 hyperlipidemia-continue statin. Lipids and liver monitored by primary care.  Olga Millers, MD

## 2016-10-15 ENCOUNTER — Ambulatory Visit (INDEPENDENT_AMBULATORY_CARE_PROVIDER_SITE_OTHER): Payer: Medicare HMO | Admitting: Cardiology

## 2016-10-15 ENCOUNTER — Encounter: Payer: Self-pay | Admitting: Cardiology

## 2016-10-15 VITALS — BP 120/80 | HR 60 | Ht 72.0 in | Wt 202.6 lb

## 2016-10-15 DIAGNOSIS — E7800 Pure hypercholesterolemia, unspecified: Secondary | ICD-10-CM

## 2016-10-15 DIAGNOSIS — I2510 Atherosclerotic heart disease of native coronary artery without angina pectoris: Secondary | ICD-10-CM

## 2016-10-15 NOTE — Patient Instructions (Signed)
Your physician wants you to follow-up in: ONE YEAR WITH DR CRENSHAW You will receive a reminder letter in the mail two months in advance. If you don't receive a letter, please call our office to schedule the follow-up appointment.   If you need a refill on your cardiac medications before your next appointment, please call your pharmacy.  

## 2016-11-10 ENCOUNTER — Other Ambulatory Visit: Payer: Self-pay | Admitting: Cardiology

## 2016-11-11 NOTE — Telephone Encounter (Signed)
REFILL 

## 2017-01-06 ENCOUNTER — Other Ambulatory Visit: Payer: Self-pay | Admitting: Cardiology

## 2017-01-06 DIAGNOSIS — E782 Mixed hyperlipidemia: Secondary | ICD-10-CM

## 2017-01-06 NOTE — Telephone Encounter (Signed)
Rx(s) sent to pharmacy electronically.  

## 2017-02-06 DIAGNOSIS — Z23 Encounter for immunization: Secondary | ICD-10-CM | POA: Diagnosis not present

## 2017-03-03 DIAGNOSIS — E7800 Pure hypercholesterolemia, unspecified: Secondary | ICD-10-CM | POA: Diagnosis not present

## 2017-03-03 DIAGNOSIS — I722 Aneurysm of renal artery: Secondary | ICD-10-CM | POA: Diagnosis not present

## 2017-03-03 DIAGNOSIS — I2510 Atherosclerotic heart disease of native coronary artery without angina pectoris: Secondary | ICD-10-CM | POA: Diagnosis not present

## 2017-03-03 DIAGNOSIS — Z125 Encounter for screening for malignant neoplasm of prostate: Secondary | ICD-10-CM | POA: Diagnosis not present

## 2017-03-03 DIAGNOSIS — D692 Other nonthrombocytopenic purpura: Secondary | ICD-10-CM | POA: Diagnosis not present

## 2017-03-03 DIAGNOSIS — E039 Hypothyroidism, unspecified: Secondary | ICD-10-CM | POA: Diagnosis not present

## 2017-03-03 DIAGNOSIS — Z6829 Body mass index (BMI) 29.0-29.9, adult: Secondary | ICD-10-CM | POA: Diagnosis not present

## 2017-03-03 DIAGNOSIS — Z79899 Other long term (current) drug therapy: Secondary | ICD-10-CM | POA: Diagnosis not present

## 2017-03-03 DIAGNOSIS — Z0000 Encounter for general adult medical examination without abnormal findings: Secondary | ICD-10-CM | POA: Diagnosis not present

## 2017-07-22 DIAGNOSIS — J069 Acute upper respiratory infection, unspecified: Secondary | ICD-10-CM | POA: Diagnosis not present

## 2017-07-30 DIAGNOSIS — J209 Acute bronchitis, unspecified: Secondary | ICD-10-CM | POA: Diagnosis not present

## 2017-08-15 ENCOUNTER — Telehealth: Payer: Self-pay | Admitting: Cardiology

## 2017-08-15 NOTE — Telephone Encounter (Signed)
New message    Patient spouse calling for patient to request order for AAA duplex

## 2017-08-28 ENCOUNTER — Other Ambulatory Visit: Payer: Self-pay | Admitting: Cardiology

## 2017-08-28 DIAGNOSIS — I723 Aneurysm of iliac artery: Secondary | ICD-10-CM

## 2017-09-05 ENCOUNTER — Ambulatory Visit (HOSPITAL_COMMUNITY)
Admission: RE | Admit: 2017-09-05 | Discharge: 2017-09-05 | Disposition: A | Discharge status: Home / Self Care | Payer: Medicare HMO | Source: Ambulatory Visit | Attending: Cardiovascular Disease | Admitting: Cardiovascular Disease

## 2017-09-05 ENCOUNTER — Other Ambulatory Visit: Payer: Self-pay | Admitting: *Deleted

## 2017-09-05 DIAGNOSIS — E785 Hyperlipidemia, unspecified: Secondary | ICD-10-CM | POA: Diagnosis not present

## 2017-09-05 DIAGNOSIS — I77811 Abdominal aortic ectasia: Secondary | ICD-10-CM | POA: Diagnosis not present

## 2017-09-05 DIAGNOSIS — I714 Abdominal aortic aneurysm, without rupture, unspecified: Secondary | ICD-10-CM

## 2017-09-05 DIAGNOSIS — Z87891 Personal history of nicotine dependence: Secondary | ICD-10-CM | POA: Insufficient documentation

## 2017-09-05 DIAGNOSIS — I723 Aneurysm of iliac artery: Secondary | ICD-10-CM

## 2017-09-05 DIAGNOSIS — I700 Atherosclerosis of aorta: Secondary | ICD-10-CM | POA: Insufficient documentation

## 2017-09-05 DIAGNOSIS — I2510 Atherosclerotic heart disease of native coronary artery without angina pectoris: Secondary | ICD-10-CM | POA: Diagnosis not present

## 2017-10-04 ENCOUNTER — Other Ambulatory Visit: Payer: Self-pay | Admitting: Cardiology

## 2017-10-04 DIAGNOSIS — E782 Mixed hyperlipidemia: Secondary | ICD-10-CM

## 2017-10-08 ENCOUNTER — Other Ambulatory Visit: Payer: Self-pay | Admitting: Cardiology

## 2017-10-08 DIAGNOSIS — E782 Mixed hyperlipidemia: Secondary | ICD-10-CM

## 2017-10-08 NOTE — Telephone Encounter (Signed)
Rx request sent to pharmacy.  

## 2017-11-04 NOTE — Progress Notes (Signed)
HPI: FU coronary artery disease. Cardiac catheterization in 2007 following a myocardial infarction showed significant disease in the mid circumflex and first marginal branch. There was also significant disease in the right coronary artery. He had a 40% LAD. He had 2 stents to the circumflex, 1 stent to the obtuse marginal and 3 overlapping stents to the right coronary artery. His ejection fraction was 45% with inferior basal hypokinesis. Nuclear study 5/17 showed EF 49; inferior infarct with peri-infarct ischemia towards the apex. Abd ultrasound 5/19 showed RCI 1.9 and LCI 1.5 cm; fu 1 year. Since last seen, the patient denies any dyspnea on exertion, orthopnea, PND, pedal edema, palpitations, syncope or chest pain.   Current Outpatient Medications  Medication Sig Dispense Refill  . aspirin 81 MG tablet Take 81 mg by mouth daily.    Marland Kitchen. atorvastatin (LIPITOR) 80 MG tablet TAKE 1 TABLET BY MOUTH EVERY DAY 30 tablet 1  . atorvastatin (LIPITOR) 80 MG tablet TAKE 1 TABLET BY MOUTH EVERY DAY 90 tablet 2  . losartan (COZAAR) 25 MG tablet Take 1 tablet (25 mg total) by mouth daily. 90 tablet 3  . metoprolol succinate (TOPROL-XL) 25 MG 24 hr tablet TAKE 1/2 TABLET BY MOUTH ONCE DAILY 90 tablet 3  . nitroGLYCERIN (NITROSTAT) 0.4 MG SL tablet Place 1 tablet (0.4 mg total) under the tongue every 5 (five) minutes as needed for chest pain. 25 tablet 3   No current facility-administered medications for this visit.      Past Medical History:  Diagnosis Date  . AAA (abdominal aortic aneurysm) (HCC)    a.08/2015 Aortic Duplex: 2.2 x 2.9 cm Infrarenal fusiform AAA.  Marland Kitchen. Coronary atherosclerosis of native coronary artery [I25.10]    a. 2007 NSTEMI/PCI: severe dzs in mLCX (DES x 2), OM1 (DES x1), and RCA (DES x 3 in overlapping fashion), 40% LAD;  b. 08/2015 Myoview: EF 49%, medium defect/moderate severity in basal inferior, mid inferior, and apical inferior locations->inf infarct w/ peri-infarct ischemia--pt  doing well @ f/u--med Rx.  Marland Kitchen. DIZZINESS   . Mixed hyperlipidemia   . Right Common iliac aneurysm (HCC)    a. 08/2015 Duplex: 2.5 x 2.2 cm R CIA aneurysm - stable.    Past Surgical History:  Procedure Laterality Date  . CORONARY ANGIOPLASTY WITH STENT PLACEMENT  11/01/2015   LAD 40%, CFX 95%>0% w/ overlapping Taxus 2.5 x 24 mm, 2.5 x 12 mm DES, OM 90%>0% w/ Taxus 2.5 x 16 mm DES, RCA 95%>0% w/ overlapping Taxus 2.5 x 24 mm x 2 and a 2.75 x 32 mm DES, EF 45%        Social History   Socioeconomic History  . Marital status: Married    Spouse name: Not on file  . Number of children: Not on file  . Years of education: Not on file  . Highest education level: Not on file  Occupational History  . Not on file  Social Needs  . Financial resource strain: Not on file  . Food insecurity:    Worry: Not on file    Inability: Not on file  . Transportation needs:    Medical: Not on file    Non-medical: Not on file  Tobacco Use  . Smoking status: Former Games developermoker  . Smokeless tobacco: Never Used  Substance and Sexual Activity  . Alcohol use: Yes    Comment: ocassionally  . Drug use: No  . Sexual activity: Not on file  Lifestyle  . Physical activity:  Days per week: Not on file    Minutes per session: Not on file  . Stress: Not on file  Relationships  . Social connections:    Talks on phone: Not on file    Gets together: Not on file    Attends religious service: Not on file    Active member of club or organization: Not on file    Attends meetings of clubs or organizations: Not on file    Relationship status: Not on file  . Intimate partner violence:    Fear of current or ex partner: Not on file    Emotionally abused: Not on file    Physically abused: Not on file    Forced sexual activity: Not on file  Other Topics Concern  . Not on file  Social History Narrative  . Not on file    Family History  Problem Relation Age of Onset  . Coronary artery disease Father        Multiple  male relatives    ROS: no fevers or chills, productive cough, hemoptysis, dysphasia, odynophagia, melena, hematochezia, dysuria, hematuria, rash, seizure activity, orthopnea, PND, pedal edema, claudication. Remaining systems are negative.  Physical Exam: Well-developed well-nourished in no acute distress.  Skin is warm and dry.  HEENT is normal.  Neck is supple.  Bilateral bruits Chest is clear to auscultation with normal expansion.  Cardiovascular exam is regular rate and rhythm.  Abdominal exam nontender or distended. No masses palpated. Extremities show no edema. neuro grossly intact  ECG-sinus bradycardia at a rate of 48.  Occasional PAC.  No ST changes.  First-degree AV block.  Personally reviewed  A/P  1 coronary artery disease-patient doing well with no chest pain.  Continue medical therapy with aspirin and statin.  2 hyperlipidemia-continue statin.  Lipids and liver monitored by primary care.  3 iliac aneurysms-plan follow-up ultrasound May 2020.  4 ischemic cardiomyopathy-continue ARB and beta-blocker.  5 bilateral carotid bruits-schedule carotid Dopplers.  Olga Millers, MD

## 2017-11-14 ENCOUNTER — Ambulatory Visit: Payer: Medicare HMO | Admitting: Cardiology

## 2017-11-14 ENCOUNTER — Encounter: Payer: Self-pay | Admitting: Cardiology

## 2017-11-14 VITALS — BP 153/87 | HR 48 | Ht 71.0 in | Wt 201.2 lb

## 2017-11-14 DIAGNOSIS — E7800 Pure hypercholesterolemia, unspecified: Secondary | ICD-10-CM | POA: Diagnosis not present

## 2017-11-14 DIAGNOSIS — R0989 Other specified symptoms and signs involving the circulatory and respiratory systems: Secondary | ICD-10-CM

## 2017-11-14 DIAGNOSIS — I2510 Atherosclerotic heart disease of native coronary artery without angina pectoris: Secondary | ICD-10-CM

## 2017-11-14 NOTE — Patient Instructions (Signed)
Medication Instructions:   NO CHANGE  Testing/Procedures:  Your physician has requested that you have a carotid duplex. This test is an ultrasound of the carotid arteries in your neck. It looks at blood flow through these arteries that supply the brain with blood. Allow one hour for this exam. There are no restrictions or special instructions.    Follow-Up:  Your physician wants you to follow-up in: ONE YEAR WITH DR CRENSHAW You will receive a reminder letter in the mail two months in advance. If you don't receive a letter, please call our office to schedule the follow-up appointment.   If you need a refill on your cardiac medications before your next appointment, please call your pharmacy.    

## 2017-11-20 ENCOUNTER — Ambulatory Visit (HOSPITAL_COMMUNITY)
Admission: RE | Admit: 2017-11-20 | Discharge: 2017-11-20 | Disposition: A | Discharge status: Home / Self Care | Payer: Medicare HMO | Source: Ambulatory Visit | Attending: Cardiology | Admitting: Cardiology

## 2017-11-20 DIAGNOSIS — R0989 Other specified symptoms and signs involving the circulatory and respiratory systems: Secondary | ICD-10-CM | POA: Diagnosis not present

## 2018-01-09 DIAGNOSIS — Z23 Encounter for immunization: Secondary | ICD-10-CM | POA: Diagnosis not present

## 2018-01-28 ENCOUNTER — Other Ambulatory Visit: Payer: Self-pay | Admitting: Cardiology

## 2018-04-07 DIAGNOSIS — M109 Gout, unspecified: Secondary | ICD-10-CM | POA: Diagnosis not present

## 2018-04-07 DIAGNOSIS — Z125 Encounter for screening for malignant neoplasm of prostate: Secondary | ICD-10-CM | POA: Diagnosis not present

## 2018-04-07 DIAGNOSIS — E7800 Pure hypercholesterolemia, unspecified: Secondary | ICD-10-CM | POA: Diagnosis not present

## 2018-04-07 DIAGNOSIS — Z0000 Encounter for general adult medical examination without abnormal findings: Secondary | ICD-10-CM | POA: Diagnosis not present

## 2018-04-07 DIAGNOSIS — E039 Hypothyroidism, unspecified: Secondary | ICD-10-CM | POA: Diagnosis not present

## 2018-04-07 DIAGNOSIS — Z79899 Other long term (current) drug therapy: Secondary | ICD-10-CM | POA: Diagnosis not present

## 2018-04-07 DIAGNOSIS — Z1211 Encounter for screening for malignant neoplasm of colon: Secondary | ICD-10-CM | POA: Diagnosis not present

## 2018-08-06 ENCOUNTER — Other Ambulatory Visit: Payer: Self-pay | Admitting: Cardiology

## 2018-08-06 DIAGNOSIS — E782 Mixed hyperlipidemia: Secondary | ICD-10-CM

## 2018-08-06 NOTE — Telephone Encounter (Signed)
Atorvastatin 80mg refilled  

## 2018-09-22 ENCOUNTER — Ambulatory Visit (HOSPITAL_COMMUNITY)
Admission: RE | Admit: 2018-09-22 | Discharge: 2018-09-22 | Disposition: A | Discharge status: Home / Self Care | Payer: Medicare HMO | Source: Ambulatory Visit | Attending: Cardiology | Admitting: Cardiology

## 2018-09-22 ENCOUNTER — Other Ambulatory Visit: Payer: Self-pay

## 2018-09-22 ENCOUNTER — Other Ambulatory Visit: Payer: Self-pay | Admitting: Cardiology

## 2018-09-22 DIAGNOSIS — I714 Abdominal aortic aneurysm, without rupture, unspecified: Secondary | ICD-10-CM

## 2018-09-22 DIAGNOSIS — I723 Aneurysm of iliac artery: Secondary | ICD-10-CM

## 2018-09-23 ENCOUNTER — Other Ambulatory Visit: Payer: Self-pay | Admitting: *Deleted

## 2018-09-23 DIAGNOSIS — I714 Abdominal aortic aneurysm, without rupture, unspecified: Secondary | ICD-10-CM

## 2018-10-14 DIAGNOSIS — H2513 Age-related nuclear cataract, bilateral: Secondary | ICD-10-CM | POA: Diagnosis not present

## 2018-10-14 DIAGNOSIS — H1045 Other chronic allergic conjunctivitis: Secondary | ICD-10-CM | POA: Diagnosis not present

## 2018-11-12 ENCOUNTER — Other Ambulatory Visit: Payer: Self-pay | Admitting: Cardiology

## 2019-01-14 NOTE — Progress Notes (Signed)
HPI: FU coronary artery disease. Cardiac catheterization in 2007 following a myocardial infarction showed significant disease in the mid circumflex and first marginal branch. There was also significant disease in the right coronary artery. He had a 40% LAD. He had 2 stents to the circumflex, 1 stent to the obtuse marginal and 3 overlapping stents to the right coronary artery. His ejection fraction was 45% with inferior basal hypokinesis. Nuclear study5/17showed EF 49; inferiorinfarct with peri-infarct ischemia towards the apex.Carotid Dopplers August 2019 showed 1 to 39% bilateral stenosis.  Abdominal ultrasound June 2020 showed ectasia with largest dimension being 2.7 cm.  The right common iliac measured 2 cm.  Since last seen,the patient denies any dyspnea on exertion, orthopnea, PND, pedal edema, palpitations, syncope or chest pain.   Current Outpatient Medications  Medication Sig Dispense Refill  . aspirin 81 MG tablet Take 81 mg by mouth daily.    Marland Kitchen atorvastatin (LIPITOR) 80 MG tablet TAKE 1 TABLET BY MOUTH EVERY DAY 90 tablet 2  . losartan (COZAAR) 25 MG tablet Take 1 tablet (25 mg total) by mouth daily. 90 tablet 3  . metoprolol succinate (TOPROL-XL) 25 MG 24 hr tablet TAKE 1/2 TABLET BY MOUTH ONCE DAILY 45 tablet 0  . nitroGLYCERIN (NITROSTAT) 0.4 MG SL tablet Place 1 tablet (0.4 mg total) under the tongue every 5 (five) minutes as needed for chest pain. 25 tablet 3   No current facility-administered medications for this visit.      Past Medical History:  Diagnosis Date  . AAA (abdominal aortic aneurysm) (HCC)    a.08/2015 Aortic Duplex: 2.2 x 2.9 cm Infrarenal fusiform AAA.  Marland Kitchen Coronary atherosclerosis of native coronary artery [I25.10]    a. 2007 NSTEMI/PCI: severe dzs in mLCX (DES x 2), OM1 (DES x1), and RCA (DES x 3 in overlapping fashion), 40% LAD;  b. 08/2015 Myoview: EF 49%, medium defect/moderate severity in basal inferior, mid inferior, and apical inferior  locations->inf infarct w/ peri-infarct ischemia--pt doing well @ f/u--med Rx.  Marland Kitchen DIZZINESS   . Mixed hyperlipidemia   . Right Common iliac aneurysm ([place])    a. 08/2015 Duplex: 2.5 x 2.2 cm R CIA aneurysm - stable.    Past Surgical History:  Procedure Laterality Date  . CORONARY ANGIOPLASTY WITH STENT PLACEMENT  11/01/2015   LAD 40%, CFX 95%>0% w/ overlapping Taxus 2.5 x 24 mm, 2.5 x 12 mm DES, OM 90%>0% w/ Taxus 2.5 x 16 mm DES, RCA 95%>0% w/ overlapping Taxus 2.5 x 24 mm x 2 and a 2.75 x 32 mm DES, EF 45%        Social History   Socioeconomic History  . Marital status: Married    Spouse name: Not on file  . Number of children: Not on file  . Years of education: Not on file  . Highest education level: Not on file  Occupational History  . Not on file  Social Needs  . Financial resource strain: Not on file  . Food insecurity    Worry: Not on file    Inability: Not on file  . Transportation needs    Medical: Not on file    Non-medical: Not on file  Tobacco Use  . Smoking status: Former Research scientist (life sciences)  . Smokeless tobacco: Never Used  Substance and Sexual Activity  . Alcohol use: Yes    Comment: ocassionally  . Drug use: No  . Sexual activity: Not on file  Lifestyle  . Physical activity    Days  per week: Not on file    Minutes per session: Not on file  . Stress: Not on file  Relationships  . Social Musician on phone: Not on file    Gets together: Not on file    Attends religious service: Not on file    Active member of club or organization: Not on file    Attends meetings of clubs or organizations: Not on file    Relationship status: Not on file  . Intimate partner violence    Fear of current or ex partner: Not on file    Emotionally abused: Not on file    Physically abused: Not on file    Forced sexual activity: Not on file  Other Topics Concern  . Not on file  Social History Narrative  . Not on file    Family History  Problem Relation Age of Onset  .  Coronary artery disease Father        Multiple male relatives    ROS: no fevers or chills, productive cough, hemoptysis, dysphasia, odynophagia, melena, hematochezia, dysuria, hematuria, rash, seizure activity, orthopnea, PND, pedal edema, claudication. Remaining systems are negative.  Physical Exam: Well-developed well-nourished in no acute distress.  Skin is warm and dry.  HEENT is normal.  Neck is supple.  Chest is clear to auscultation with normal expansion.  Cardiovascular exam is regular rate and rhythm.  Abdominal exam nontender or distended. No masses palpated. Extremities show no edema. neuro grossly intact  ECG-sinus rhythm at a rate of 62, occasional PVC.  Personally reviewed  A/P  1 coronary artery disease-patient denies chest pain.  Continue medical therapy with aspirin and statin.  2 ischemic cardiomyopathy-continue ARB and beta-blocker.  Potassium and renal function monitored by primary care  3 hyperlipidemia-continue statin.  Lipids and liver monitored by primary care.  4 iliac aneurysms-follow-up ultrasound June 2021.   Olga Millers, MD

## 2019-01-15 ENCOUNTER — Telehealth: Payer: Self-pay | Admitting: Cardiology

## 2019-01-15 NOTE — Telephone Encounter (Signed)
Wife of patient called and wanted to know if she will be allowed to come into the clinic with her husband.  The patient does not have any mobility or memory issues, but the patient tends to not share information following the appointment. The patient also does not ask many questions. In the past, the wife would be the one to ask all of the questions, and being with him, she would be able to hear what Dr. Stanford Breed would say in real time and even take notes.  If the office could mail a letter with what was discussed at the appointment, that would be fine too. She is just concerned because it is a more serious than a dentist appt that she doesn't need to know all of the details. She agrees to wear a mask if allowed to come  One way or the other, please let her know.

## 2019-01-15 NOTE — Telephone Encounter (Signed)
Routed to primary nurse 

## 2019-01-15 NOTE — Telephone Encounter (Signed)
Spoke with pt wife, she will have the patient call her when dr Stanford Breed is in the room so she can be involved. She agreed with that plan.

## 2019-01-18 ENCOUNTER — Other Ambulatory Visit: Payer: Self-pay

## 2019-01-18 ENCOUNTER — Ambulatory Visit: Payer: Medicare HMO | Admitting: Cardiology

## 2019-01-18 ENCOUNTER — Encounter: Payer: Self-pay | Admitting: Cardiology

## 2019-01-18 VITALS — BP 126/78 | HR 62 | Temp 97.7°F | Ht 69.0 in | Wt 192.0 lb

## 2019-01-18 DIAGNOSIS — E7800 Pure hypercholesterolemia, unspecified: Secondary | ICD-10-CM

## 2019-01-18 DIAGNOSIS — I2510 Atherosclerotic heart disease of native coronary artery without angina pectoris: Secondary | ICD-10-CM | POA: Diagnosis not present

## 2019-01-18 DIAGNOSIS — I723 Aneurysm of iliac artery: Secondary | ICD-10-CM | POA: Diagnosis not present

## 2019-01-18 NOTE — Patient Instructions (Signed)

## 2019-01-20 ENCOUNTER — Other Ambulatory Visit (INDEPENDENT_AMBULATORY_CARE_PROVIDER_SITE_OTHER): Payer: Medicare HMO

## 2019-01-20 DIAGNOSIS — I2510 Atherosclerotic heart disease of native coronary artery without angina pectoris: Secondary | ICD-10-CM | POA: Diagnosis not present

## 2019-01-20 DIAGNOSIS — R42 Dizziness and giddiness: Secondary | ICD-10-CM

## 2019-01-20 DIAGNOSIS — Z23 Encounter for immunization: Secondary | ICD-10-CM | POA: Diagnosis not present

## 2019-01-20 DIAGNOSIS — E782 Mixed hyperlipidemia: Secondary | ICD-10-CM

## 2019-02-07 ENCOUNTER — Other Ambulatory Visit: Payer: Self-pay | Admitting: Cardiology

## 2019-04-30 ENCOUNTER — Other Ambulatory Visit: Payer: Self-pay | Admitting: Cardiology

## 2019-04-30 DIAGNOSIS — E782 Mixed hyperlipidemia: Secondary | ICD-10-CM

## 2019-06-16 DIAGNOSIS — Z125 Encounter for screening for malignant neoplasm of prostate: Secondary | ICD-10-CM | POA: Diagnosis not present

## 2019-06-16 DIAGNOSIS — D692 Other nonthrombocytopenic purpura: Secondary | ICD-10-CM | POA: Diagnosis not present

## 2019-06-16 DIAGNOSIS — I2510 Atherosclerotic heart disease of native coronary artery without angina pectoris: Secondary | ICD-10-CM | POA: Diagnosis not present

## 2019-06-16 DIAGNOSIS — E7800 Pure hypercholesterolemia, unspecified: Secondary | ICD-10-CM | POA: Diagnosis not present

## 2019-06-16 DIAGNOSIS — I722 Aneurysm of renal artery: Secondary | ICD-10-CM | POA: Diagnosis not present

## 2019-06-16 DIAGNOSIS — I779 Disorder of arteries and arterioles, unspecified: Secondary | ICD-10-CM | POA: Diagnosis not present

## 2019-06-16 DIAGNOSIS — E039 Hypothyroidism, unspecified: Secondary | ICD-10-CM | POA: Diagnosis not present

## 2019-06-16 DIAGNOSIS — Z0000 Encounter for general adult medical examination without abnormal findings: Secondary | ICD-10-CM | POA: Diagnosis not present

## 2019-06-16 DIAGNOSIS — Z79899 Other long term (current) drug therapy: Secondary | ICD-10-CM | POA: Diagnosis not present

## 2019-06-30 ENCOUNTER — Telehealth: Payer: Self-pay | Admitting: Cardiology

## 2019-06-30 NOTE — Telephone Encounter (Signed)
  Pt's wife called requesting a note from Dr. Jens Som that its ok for the pt to get covid vaccine. She adamant on getting a note she said she was told to get one. Pt is scheduled on 07/02/19.  Please advise

## 2019-06-30 NOTE — Telephone Encounter (Signed)
Spoke with pts wife per DPR and notified that a note for covid vaccination approval was sent through [facility] and a hard copy was printed for her to pick up at the front of the office if she liked. Wife was very thankful and stated she would come later today to get it.

## 2019-10-01 ENCOUNTER — Ambulatory Visit (HOSPITAL_COMMUNITY)
Admission: RE | Admit: 2019-10-01 | Discharge: 2019-10-01 | Disposition: A | Discharge status: Home / Self Care | Payer: Medicare HMO | Source: Ambulatory Visit | Attending: Cardiology | Admitting: Cardiology

## 2019-10-01 ENCOUNTER — Other Ambulatory Visit (HOSPITAL_COMMUNITY): Payer: Self-pay | Admitting: Cardiology

## 2019-10-01 ENCOUNTER — Other Ambulatory Visit: Payer: Self-pay

## 2019-10-01 ENCOUNTER — Other Ambulatory Visit: Payer: Self-pay | Admitting: *Deleted

## 2019-10-01 DIAGNOSIS — I723 Aneurysm of iliac artery: Secondary | ICD-10-CM

## 2019-10-01 DIAGNOSIS — I714 Abdominal aortic aneurysm, without rupture, unspecified: Secondary | ICD-10-CM

## 2019-11-03 ENCOUNTER — Other Ambulatory Visit: Payer: Self-pay | Admitting: Cardiology

## 2019-11-18 DIAGNOSIS — H1045 Other chronic allergic conjunctivitis: Secondary | ICD-10-CM | POA: Diagnosis not present

## 2019-11-18 DIAGNOSIS — H2513 Age-related nuclear cataract, bilateral: Secondary | ICD-10-CM | POA: Diagnosis not present

## 2020-01-20 NOTE — Progress Notes (Signed)
HPI: FU coronary artery disease. Cardiac catheterization in 2007 following a myocardial infarction showed significant disease in the mid circumflex and first marginal branch. There was also significant disease in the right coronary artery. He had a 40% LAD. He had 2 stents to the circumflex, 1 stent to the obtuse marginal and 3 overlapping stents to the right coronary artery. His ejection fraction was 45% with inferior basal hypokinesis. Nuclear study5/17showed EF 49; inferiorinfarct with peri-infarct ischemia towards the apex.Carotid Dopplers August 2019 showed 1 to 39% bilateral stenosis. Abdominal ultrasound June 2021 showed right common iliac artery aneurysm measuring 2.2 x 2.2 cm, 2.9 cm aortic aneurysm.  Since last seen, the patient denies any dyspnea on exertion, orthopnea, PND, pedal edema, palpitations, syncope or chest pain.   Current Outpatient Medications  Medication Sig Dispense Refill  . aspirin 81 MG tablet Take 81 mg by mouth daily.    Marland Kitchen atorvastatin (LIPITOR) 80 MG tablet TAKE 1 TABLET BY MOUTH EVERY DAY 90 tablet 3  . fluticasone (FLONASE) 50 MCG/ACT nasal spray Place into both nostrils as needed for allergies or rhinitis.    Marland Kitchen losartan (COZAAR) 25 MG tablet Take 1 tablet (25 mg total) by mouth daily. 90 tablet 3  . metoprolol succinate (TOPROL-XL) 25 MG 24 hr tablet TAKE 1/2 TABLET BY MOUTH ONCE DAILY 45 tablet 0  . nitroGLYCERIN (NITROSTAT) 0.4 MG SL tablet Place 1 tablet (0.4 mg total) under the tongue every 5 (five) minutes as needed for chest pain. 25 tablet 3   No current facility-administered medications for this visit.     Past Medical History:  Diagnosis Date  . AAA (abdominal aortic aneurysm) (HCC)    a.08/2015 Aortic Duplex: 2.2 x 2.9 cm Infrarenal fusiform AAA.  Marland Kitchen Coronary atherosclerosis of native coronary artery [I25.10]    a. 2007 NSTEMI/PCI: severe dzs in mLCX (DES x 2), OM1 (DES x1), and RCA (DES x 3 in overlapping fashion), 40% LAD;  b. 08/2015  Myoview: EF 49%, medium defect/moderate severity in basal inferior, mid inferior, and apical inferior locations->inf infarct w/ peri-infarct ischemia--pt doing well @ f/u--med Rx.  Marland Kitchen DIZZINESS   . Mixed hyperlipidemia   . Right Common iliac aneurysm (HCC)    a. 08/2015 Duplex: 2.5 x 2.2 cm R CIA aneurysm - stable.    Past Surgical History:  Procedure Laterality Date  . CORONARY ANGIOPLASTY WITH STENT PLACEMENT  11/01/2015   LAD 40%, CFX 95%>0% w/ overlapping Taxus 2.5 x 24 mm, 2.5 x 12 mm DES, OM 90%>0% w/ Taxus 2.5 x 16 mm DES, RCA 95%>0% w/ overlapping Taxus 2.5 x 24 mm x 2 and a 2.75 x 32 mm DES, EF 45%        Social History   Socioeconomic History  . Marital status: Married    Spouse name: Not on file  . Number of children: Not on file  . Years of education: Not on file  . Highest education level: Not on file  Occupational History  . Not on file  Tobacco Use  . Smoking status: Former Games developer  . Smokeless tobacco: Never Used  Substance and Sexual Activity  . Alcohol use: Yes    Comment: ocassionally  . Drug use: No  . Sexual activity: Not on file  Other Topics Concern  . Not on file  Social History Narrative  . Not on file   Social Determinants of Health   Financial Resource Strain:   . Difficulty of Paying Living Expenses: Not on  file  Food Insecurity:   . Worried About Programme researcher, broadcasting/film/video in the Last Year: Not on file  . Ran Out of Food in the Last Year: Not on file  Transportation Needs:   . Lack of Transportation (Medical): Not on file  . Lack of Transportation (Non-Medical): Not on file  Physical Activity:   . Days of Exercise per Week: Not on file  . Minutes of Exercise per Session: Not on file  Stress:   . Feeling of Stress : Not on file  Social Connections:   . Frequency of Communication with Friends and Family: Not on file  . Frequency of Social Gatherings with Friends and Family: Not on file  . Attends Religious Services: Not on file  . Active Member  of Clubs or Organizations: Not on file  . Attends Banker Meetings: Not on file  . Marital Status: Not on file  Intimate Partner Violence:   . Fear of Current or Ex-Partner: Not on file  . Emotionally Abused: Not on file  . Physically Abused: Not on file  . Sexually Abused: Not on file    Family History  Problem Relation Age of Onset  . Coronary artery disease Father        Multiple male relatives    ROS: no fevers or chills, productive cough, hemoptysis, dysphasia, odynophagia, melena, hematochezia, dysuria, hematuria, rash, seizure activity, orthopnea, PND, pedal edema, claudication. Remaining systems are negative.  Physical Exam: Well-developed well-nourished in no acute distress.  Skin is warm and dry.  HEENT is normal.  Neck is supple.  Chest is clear to auscultation with normal expansion.  Cardiovascular exam is regular rate and rhythm.  Abdominal exam nontender or distended. No masses palpated. Extremities show no edema. neuro grossly intact  ECG-sinus bradycardia with occasional PVCs, no ST changes.  Personally reviewed  A/P  1 coronary artery disease-patient doing well with no chest pain.  Plan to continue aspirin and statin.  2 ischemic cardiomyopathy-continue ARB and beta-blocker.  Plan echocardiogram to assess LV function.  3 iliac aneurysm-plan follow-up ultrasound June 2022.  4 hyperlipidemia-continue statin.  Olga Millers, MD

## 2020-02-02 ENCOUNTER — Ambulatory Visit: Payer: Medicare HMO | Admitting: Cardiology

## 2020-02-02 ENCOUNTER — Other Ambulatory Visit: Payer: Self-pay

## 2020-02-02 ENCOUNTER — Encounter: Payer: Self-pay | Admitting: Cardiology

## 2020-02-02 VITALS — BP 132/76 | HR 52 | Ht 72.0 in | Wt 197.0 lb

## 2020-02-02 DIAGNOSIS — E782 Mixed hyperlipidemia: Secondary | ICD-10-CM | POA: Diagnosis not present

## 2020-02-02 DIAGNOSIS — I2510 Atherosclerotic heart disease of native coronary artery without angina pectoris: Secondary | ICD-10-CM | POA: Diagnosis not present

## 2020-02-02 DIAGNOSIS — I714 Abdominal aortic aneurysm, without rupture, unspecified: Secondary | ICD-10-CM

## 2020-02-02 DIAGNOSIS — I723 Aneurysm of iliac artery: Secondary | ICD-10-CM | POA: Diagnosis not present

## 2020-02-02 NOTE — Patient Instructions (Signed)

## 2020-02-05 ENCOUNTER — Other Ambulatory Visit: Payer: Self-pay | Admitting: Cardiology

## 2020-02-22 ENCOUNTER — Ambulatory Visit (HOSPITAL_COMMUNITY): Payer: Medicare HMO | Attending: Cardiovascular Disease

## 2020-02-22 ENCOUNTER — Other Ambulatory Visit: Payer: Self-pay

## 2020-02-22 DIAGNOSIS — I2510 Atherosclerotic heart disease of native coronary artery without angina pectoris: Secondary | ICD-10-CM

## 2020-02-22 LAB — ECHOCARDIOGRAM COMPLETE
AR max vel: 1.31 cm2
AV Area VTI: 1.44 cm2
AV Area mean vel: 1.27 cm2
AV Mean grad: 11.7 mmHg
AV Peak grad: 21.5 mmHg
Ao pk vel: 2.32 m/s
Area-P 1/2: 2.8 cm2
S' Lateral: 3.7 cm

## 2020-03-14 DIAGNOSIS — Z23 Encounter for immunization: Secondary | ICD-10-CM | POA: Diagnosis not present

## 2020-05-06 DIAGNOSIS — H6982 Other specified disorders of Eustachian tube, left ear: Secondary | ICD-10-CM | POA: Diagnosis not present

## 2020-05-06 DIAGNOSIS — R0982 Postnasal drip: Secondary | ICD-10-CM | POA: Diagnosis not present

## 2020-05-06 DIAGNOSIS — J329 Chronic sinusitis, unspecified: Secondary | ICD-10-CM | POA: Diagnosis not present

## 2020-06-06 ENCOUNTER — Other Ambulatory Visit: Payer: Self-pay | Admitting: Cardiology

## 2020-06-06 DIAGNOSIS — E782 Mixed hyperlipidemia: Secondary | ICD-10-CM

## 2020-07-13 DIAGNOSIS — E039 Hypothyroidism, unspecified: Secondary | ICD-10-CM | POA: Diagnosis not present

## 2020-07-13 DIAGNOSIS — D692 Other nonthrombocytopenic purpura: Secondary | ICD-10-CM | POA: Diagnosis not present

## 2020-07-13 DIAGNOSIS — Z1211 Encounter for screening for malignant neoplasm of colon: Secondary | ICD-10-CM | POA: Diagnosis not present

## 2020-07-13 DIAGNOSIS — I722 Aneurysm of renal artery: Secondary | ICD-10-CM | POA: Diagnosis not present

## 2020-07-13 DIAGNOSIS — R946 Abnormal results of thyroid function studies: Secondary | ICD-10-CM | POA: Diagnosis not present

## 2020-07-13 DIAGNOSIS — R195 Other fecal abnormalities: Secondary | ICD-10-CM | POA: Diagnosis not present

## 2020-07-13 DIAGNOSIS — I2510 Atherosclerotic heart disease of native coronary artery without angina pectoris: Secondary | ICD-10-CM | POA: Diagnosis not present

## 2020-07-13 DIAGNOSIS — Z0000 Encounter for general adult medical examination without abnormal findings: Secondary | ICD-10-CM | POA: Diagnosis not present

## 2020-07-13 DIAGNOSIS — I779 Disorder of arteries and arterioles, unspecified: Secondary | ICD-10-CM | POA: Diagnosis not present

## 2020-07-13 DIAGNOSIS — Z79899 Other long term (current) drug therapy: Secondary | ICD-10-CM | POA: Diagnosis not present

## 2020-07-13 DIAGNOSIS — Z125 Encounter for screening for malignant neoplasm of prostate: Secondary | ICD-10-CM | POA: Diagnosis not present

## 2020-07-13 DIAGNOSIS — E7800 Pure hypercholesterolemia, unspecified: Secondary | ICD-10-CM | POA: Diagnosis not present

## 2020-09-21 ENCOUNTER — Other Ambulatory Visit (HOSPITAL_COMMUNITY): Payer: Self-pay | Admitting: Cardiology

## 2020-09-21 DIAGNOSIS — I723 Aneurysm of iliac artery: Secondary | ICD-10-CM

## 2020-10-02 ENCOUNTER — Other Ambulatory Visit: Payer: Self-pay

## 2020-10-02 ENCOUNTER — Ambulatory Visit (HOSPITAL_COMMUNITY)
Admission: RE | Admit: 2020-10-02 | Discharge: 2020-10-02 | Disposition: A | Discharge status: Home / Self Care | Payer: Medicare HMO | Source: Ambulatory Visit | Attending: Cardiology | Admitting: Cardiology

## 2020-10-02 ENCOUNTER — Other Ambulatory Visit (HOSPITAL_COMMUNITY): Payer: Self-pay | Admitting: Cardiology

## 2020-10-02 ENCOUNTER — Encounter: Payer: Self-pay | Admitting: *Deleted

## 2020-10-02 DIAGNOSIS — I723 Aneurysm of iliac artery: Secondary | ICD-10-CM

## 2020-11-20 DIAGNOSIS — H1045 Other chronic allergic conjunctivitis: Secondary | ICD-10-CM | POA: Diagnosis not present

## 2020-11-20 DIAGNOSIS — H2513 Age-related nuclear cataract, bilateral: Secondary | ICD-10-CM | POA: Diagnosis not present

## 2021-01-17 DIAGNOSIS — E038 Other specified hypothyroidism: Secondary | ICD-10-CM | POA: Diagnosis not present

## 2021-01-18 DIAGNOSIS — Z23 Encounter for immunization: Secondary | ICD-10-CM | POA: Diagnosis not present

## 2021-01-18 DIAGNOSIS — Z1211 Encounter for screening for malignant neoplasm of colon: Secondary | ICD-10-CM | POA: Diagnosis not present

## 2021-03-01 DIAGNOSIS — E038 Other specified hypothyroidism: Secondary | ICD-10-CM | POA: Diagnosis not present

## 2021-03-01 DIAGNOSIS — M5416 Radiculopathy, lumbar region: Secondary | ICD-10-CM | POA: Diagnosis not present

## 2021-03-08 NOTE — Progress Notes (Deleted)
HPI: FU coronary artery disease. Cardiac catheterization in 2007 following a myocardial infarction showed significant disease in the mid circumflex and first marginal branch. There was also significant disease in the right coronary artery. He had a 40% LAD. He had 2 stents to the circumflex, 1 stent to the obtuse marginal and 3 overlapping stents to the right coronary artery. His ejection fraction was 45% with inferior basal hypokinesis. Nuclear study 5/17 showed EF 49; inferior infarct with peri-infarct ischemia towards the apex. Carotid Dopplers August 2019 showed 1 to 39% bilateral stenosis. Echocardiogram November 2021 showed ejection fraction 50 to 55%, grade 1 diastolic dysfunction, moderate left atrial enlargement, mild aortic stenosis with mean gradient 12 mmHg and mildly dilated aortic root and ascending aorta.  Abdominal ultrasound June 2022 showed no abdominal aortic aneurysm, 2.6 x 2.7 mid right common iliac artery aneurysm; follow-up recommended 6 months.  Since last seen,   Current Outpatient Medications  Medication Sig Dispense Refill   metoprolol succinate (TOPROL-XL) 25 MG 24 hr tablet TAKE 1/2 TABLET BY MOUTH ONCE DAILY 90 tablet 3   aspirin 81 MG tablet Take 81 mg by mouth daily.     atorvastatin (LIPITOR) 80 MG tablet TAKE 1 TABLET BY MOUTH EVERY DAY 90 tablet 3   fluticasone (FLONASE) 50 MCG/ACT nasal spray Place into both nostrils as needed for allergies or rhinitis.     losartan (COZAAR) 25 MG tablet Take 1 tablet (25 mg total) by mouth daily. 90 tablet 3   nitroGLYCERIN (NITROSTAT) 0.4 MG SL tablet Place 1 tablet (0.4 mg total) under the tongue every 5 (five) minutes as needed for chest pain. 25 tablet 3   No current facility-administered medications for this visit.     Past Medical History:  Diagnosis Date   AAA (abdominal aortic aneurysm) (HCC)    a.08/2015 Aortic Duplex: 2.2 x 2.9 cm Infrarenal fusiform AAA.   Coronary atherosclerosis of native coronary artery  [I25.10]    a. 2007 NSTEMI/PCI: severe dzs in mLCX (DES x 2), OM1 (DES x1), and RCA (DES x 3 in overlapping fashion), 40% LAD;  b. 08/2015 Myoview: EF 49%, medium defect/moderate severity in basal inferior, mid inferior, and apical inferior locations->inf infarct w/ peri-infarct ischemia--pt doing well @ f/u--med Rx.   DIZZINESS    Mixed hyperlipidemia    Right Common iliac aneurysm (HCC)    a. 08/2015 Duplex: 2.5 x 2.2 cm R CIA aneurysm - stable.    Past Surgical History:  Procedure Laterality Date   CORONARY ANGIOPLASTY WITH STENT PLACEMENT  11/01/2015   LAD 40%, CFX 95%>0% w/ overlapping Taxus 2.5 x 24 mm, 2.5 x 12 mm DES, OM 90%>0% w/ Taxus 2.5 x 16 mm DES, RCA 95%>0% w/ overlapping Taxus 2.5 x 24 mm x 2 and a 2.75 x 32 mm DES, EF 45%        Social History   Socioeconomic History   Marital status: Married    Spouse name: Not on file   Number of children: Not on file   Years of education: Not on file   Highest education level: Not on file  Occupational History   Not on file  Tobacco Use   Smoking status: Former   Smokeless tobacco: Never  Substance and Sexual Activity   Alcohol use: Yes    Comment: ocassionally   Drug use: No   Sexual activity: Not on file  Other Topics Concern   Not on file  Social History Narrative   Not  on file   Social Determinants of Health   Financial Resource Strain: Not on file  Food Insecurity: Not on file  Transportation Needs: Not on file  Physical Activity: Not on file  Stress: Not on file  Social Connections: Not on file  Intimate Partner Violence: Not on file    Family History  Problem Relation Age of Onset   Coronary artery disease Father        Multiple male relatives    ROS: no fevers or chills, productive cough, hemoptysis, dysphasia, odynophagia, melena, hematochezia, dysuria, hematuria, rash, seizure activity, orthopnea, PND, pedal edema, claudication. Remaining systems are negative.  Physical Exam: Well-developed  well-nourished in no acute distress.  Skin is warm and dry.  HEENT is normal.  Neck is supple.  Chest is clear to auscultation with normal expansion.  Cardiovascular exam is regular rate and rhythm.  Abdominal exam nontender or distended. No masses palpated. Extremities show no edema. neuro grossly intact  ECG- personally reviewed  A/P  1 coronary artery disease-patient denies recurrent chest pain.  Plan to continue medical therapy with aspirin and statin.  2 iliac aneurysm-we will need follow-up ultrasound December 2022.  3 ischemic cardiomyopathy-LV function low normal on most recent echocardiogram.  We will continue ARB and beta-blocker.  4 hyperlipidemia-continue statin.  Kirk Ruths, MD

## 2021-03-20 ENCOUNTER — Ambulatory Visit: Payer: Medicare HMO | Admitting: Cardiology

## 2021-03-20 DIAGNOSIS — R059 Cough, unspecified: Secondary | ICD-10-CM | POA: Diagnosis not present

## 2021-03-20 DIAGNOSIS — R0982 Postnasal drip: Secondary | ICD-10-CM | POA: Diagnosis not present

## 2021-03-26 ENCOUNTER — Other Ambulatory Visit: Payer: Self-pay | Admitting: *Deleted

## 2021-03-26 DIAGNOSIS — I723 Aneurysm of iliac artery: Secondary | ICD-10-CM

## 2021-03-26 DIAGNOSIS — I7143 Infrarenal abdominal aortic aneurysm, without rupture: Secondary | ICD-10-CM

## 2021-04-12 ENCOUNTER — Other Ambulatory Visit: Payer: Self-pay

## 2021-04-12 ENCOUNTER — Other Ambulatory Visit (HOSPITAL_COMMUNITY): Payer: Self-pay | Admitting: Cardiology

## 2021-04-12 ENCOUNTER — Ambulatory Visit (HOSPITAL_COMMUNITY)
Admission: RE | Admit: 2021-04-12 | Discharge: 2021-04-12 | Disposition: A | Discharge status: Home / Self Care | Payer: Medicare HMO | Source: Ambulatory Visit | Attending: Cardiovascular Disease | Admitting: Cardiovascular Disease

## 2021-04-12 DIAGNOSIS — I723 Aneurysm of iliac artery: Secondary | ICD-10-CM | POA: Diagnosis not present

## 2021-04-12 DIAGNOSIS — I7143 Infrarenal abdominal aortic aneurysm, without rupture: Secondary | ICD-10-CM | POA: Diagnosis not present

## 2021-04-12 DIAGNOSIS — I700 Atherosclerosis of aorta: Secondary | ICD-10-CM

## 2021-04-29 ENCOUNTER — Other Ambulatory Visit: Payer: Self-pay | Admitting: Cardiology

## 2021-05-24 ENCOUNTER — Other Ambulatory Visit: Payer: Self-pay | Admitting: Cardiology

## 2021-05-24 DIAGNOSIS — E782 Mixed hyperlipidemia: Secondary | ICD-10-CM

## 2021-07-06 NOTE — Progress Notes (Signed)
? ? ? ? ?HPI:FU coronary artery disease. Cardiac catheterization in 2007 following a myocardial infarction showed significant disease in the mid circumflex and first marginal branch. There was also significant disease in the right coronary artery. He had a 40% LAD. He had 2 stents to the circumflex, 1 stent to the obtuse marginal and 3 overlapping stents to the right coronary artery. His ejection fraction was 45% with inferior basal hypokinesis. Nuclear study 5/17 showed EF 49; inferior infarct with peri-infarct ischemia towards the apex. Carotid Dopplers August 2019 showed 1 to 39% bilateral stenosis. Echocardiogram November 2021 showed ejection fraction 50 to 55%, grade 1 diastolic dysfunction, moderate left atrial enlargement, mild aortic stenosis with mean gradient 12 mmHg, mildly dilated aortic root at 41 mm.  Abdominal ultrasound December 2022 showed abnormal dilatation of the mid/distal right common iliac artery but no abdominal aortic aneurysm noted.  Since last seen, he denies dyspnea, chest pain, palpitations or syncope. ?  ? ?Current Outpatient Medications  ?Medication Sig Dispense Refill  ? aspirin 81 MG tablet Take 81 mg by mouth daily.    ? atorvastatin (LIPITOR) 80 MG tablet TAKE 1 TABLET BY MOUTH EVERY DAY 90 tablet 3  ? fluticasone (FLONASE) 50 MCG/ACT nasal spray Place into both nostrils as needed for allergies or rhinitis.    ? losartan (COZAAR) 25 MG tablet Take 1 tablet (25 mg total) by mouth daily. 90 tablet 3  ? metoprolol succinate (TOPROL-XL) 25 MG 24 hr tablet TAKE 1/2 TABLET BY MOUTH EVERY DAY 45 tablet 2  ? nitroGLYCERIN (NITROSTAT) 0.4 MG SL tablet Place 1 tablet (0.4 mg total) under the tongue every 5 (five) minutes as needed for chest pain. 25 tablet 3  ? levothyroxine (SYNTHROID) 25 MCG tablet Take 25 mcg by mouth every morning.    ? ?No current facility-administered medications for this visit.  ? ? ? ?Past Medical History:  ?Diagnosis Date  ? AAA (abdominal aortic aneurysm)   ?  a.08/2015 Aortic Duplex: 2.2 x 2.9 cm Infrarenal fusiform AAA.  ? Coronary atherosclerosis of native coronary artery [I25.10]   ? a. 2007 NSTEMI/PCI: severe dzs in mLCX (DES x 2), OM1 (DES x1), and RCA (DES x 3 in overlapping fashion), 40% LAD;  b. 08/2015 Myoview: EF 49%, medium defect/moderate severity in basal inferior, mid inferior, and apical inferior locations->inf infarct w/ peri-infarct ischemia--pt doing well @ f/u--med Rx.  ? DIZZINESS   ? Mixed hyperlipidemia   ? Right Common iliac aneurysm (HCC)   ? a. 08/2015 Duplex: 2.5 x 2.2 cm R CIA aneurysm - stable.  ? ? ?Past Surgical History:  ?Procedure Laterality Date  ? CORONARY ANGIOPLASTY WITH STENT PLACEMENT  11/01/2015  ? LAD 40%, CFX 95%>0% w/ overlapping Taxus 2.5 x 24 mm, 2.5 x 12 mm DES, OM 90%>0% w/ Taxus 2.5 x 16 mm DES, RCA 95%>0% w/ overlapping Taxus 2.5 x 24 mm x 2 and a 2.75 x 32 mm DES, EF 45%      ? ? ?Social History  ? ?Socioeconomic History  ? Marital status: Married  ?  Spouse name: Not on file  ? Number of children: Not on file  ? Years of education: Not on file  ? Highest education level: Not on file  ?Occupational History  ? Not on file  ?Tobacco Use  ? Smoking status: Former  ? Smokeless tobacco: Never  ?Substance and Sexual Activity  ? Alcohol use: Yes  ?  Comment: ocassionally  ? Drug use: No  ? Sexual  activity: Not on file  ?Other Topics Concern  ? Not on file  ?Social History Narrative  ? Not on file  ? ?Social Determinants of Health  ? ?Financial Resource Strain: Not on file  ?Food Insecurity: Not on file  ?Transportation Needs: Not on file  ?Physical Activity: Not on file  ?Stress: Not on file  ?Social Connections: Not on file  ?Intimate Partner Violence: Not on file  ? ? ?Family History  ?Problem Relation Age of Onset  ? Coronary artery disease Father   ?     Multiple male relatives  ? ? ?ROS: Back hip and knee pain but no fevers or chills, productive cough, hemoptysis, dysphasia, odynophagia, melena, hematochezia, dysuria,  hematuria, rash, seizure activity, orthopnea, PND, pedal edema, claudication. Remaining systems are negative. ? ?Physical Exam: ?Well-developed well-nourished in no acute distress.  ?Skin is warm and dry.  ?HEENT is normal.  ?Neck is supple.  ?Chest is clear to auscultation with normal expansion.  ?Cardiovascular exam is regular rate and rhythm.  2/6 systolic murmur left sternal border.  No diastolic murmur. ?Abdominal exam nontender or distended. No masses palpated. ?Extremities show no edema. ?neuro grossly intact ? ?ECG-sinus bradycardia at a rate of 56, no ST changes.  Personally reviewed ? ?A/P ? ?1 coronary artery disease-patient denies chest pain.  Continue aspirin and statin. ? ?2 history of iliac aneurysm-we will arrange follow-up ultrasound to reassess 6/23. ? ?3 hyperlipidemia-continue statin.  We will have most recent lipids and liver forwarded to [place] from primary care. ? ?4 ischemic cardiomyopathy-LV function low normal on most recent echocardiogram.  Continue ARB and beta-blocker. ? ?5 aortic stenosis-mild on most recent echocardiogram.  We will likely repeat study when he returns in 1 year.  Note S2 is not diminished today. ? ?6 hypertension-blood pressure elevated.  Increase losartan to 50 mg daily and follow. ? ?Olga Millers, MD ? ? ? ?

## 2021-07-20 ENCOUNTER — Ambulatory Visit: Payer: Medicare HMO | Admitting: Cardiology

## 2021-07-20 ENCOUNTER — Encounter: Payer: Self-pay | Admitting: Cardiology

## 2021-07-20 VITALS — BP 150/80 | HR 56 | Ht 72.0 in | Wt 201.6 lb

## 2021-07-20 DIAGNOSIS — Z125 Encounter for screening for malignant neoplasm of prostate: Secondary | ICD-10-CM | POA: Diagnosis not present

## 2021-07-20 DIAGNOSIS — M109 Gout, unspecified: Secondary | ICD-10-CM | POA: Diagnosis not present

## 2021-07-20 DIAGNOSIS — E039 Hypothyroidism, unspecified: Secondary | ICD-10-CM | POA: Diagnosis not present

## 2021-07-20 DIAGNOSIS — S68119D Complete traumatic metacarpophalangeal amputation of unspecified finger, subsequent encounter: Secondary | ICD-10-CM | POA: Diagnosis not present

## 2021-07-20 DIAGNOSIS — E782 Mixed hyperlipidemia: Secondary | ICD-10-CM | POA: Diagnosis not present

## 2021-07-20 DIAGNOSIS — Z0000 Encounter for general adult medical examination without abnormal findings: Secondary | ICD-10-CM | POA: Diagnosis not present

## 2021-07-20 DIAGNOSIS — E7800 Pure hypercholesterolemia, unspecified: Secondary | ICD-10-CM | POA: Diagnosis not present

## 2021-07-20 DIAGNOSIS — D692 Other nonthrombocytopenic purpura: Secondary | ICD-10-CM | POA: Diagnosis not present

## 2021-07-20 DIAGNOSIS — I2510 Atherosclerotic heart disease of native coronary artery without angina pectoris: Secondary | ICD-10-CM | POA: Diagnosis not present

## 2021-07-20 DIAGNOSIS — I723 Aneurysm of iliac artery: Secondary | ICD-10-CM | POA: Diagnosis not present

## 2021-07-20 DIAGNOSIS — Z79899 Other long term (current) drug therapy: Secondary | ICD-10-CM | POA: Diagnosis not present

## 2021-07-20 DIAGNOSIS — I722 Aneurysm of renal artery: Secondary | ICD-10-CM | POA: Diagnosis not present

## 2021-07-20 DIAGNOSIS — I10 Essential (primary) hypertension: Secondary | ICD-10-CM | POA: Diagnosis not present

## 2021-07-20 MED ORDER — LOSARTAN POTASSIUM 50 MG PO TABS: 50.0000 mg | ORAL_TABLET | Freq: Every day | ORAL | 3 refills | Status: DC

## 2021-07-20 NOTE — Patient Instructions (Signed)
Medication Instructions:  ?INCREASE Losartan to 50 mg daily ? ?*If you need a refill on your cardiac medications before your next appointment, please call your pharmacy* ? ?Follow-Up: ?At [facility], you and your health needs are our priority.  As part of our continuing mission to provide you with exceptional heart care, we have created designated Provider Care Teams.  These Care Teams include your primary Cardiologist (physician) and Advanced Practice Providers (APPs -  Physician Assistants and Nurse Practitioners) who all work together to provide you with the care you need, when you need it. ? ?We recommend signing up for the patient portal called "MyChart".  Sign up information is provided on this After Visit Summary.  MyChart is used to connect with patients for Virtual Visits (Telemedicine).  Patients are able to view lab/test results, encounter notes, upcoming appointments, etc.  Non-urgent messages can be sent to your provider as well.   ?To learn more about what you can do with MyChart, go to [email or web address].   ? ?Your next appointment:   ?12 month(s) ? ?The format for your next appointment:   ?In Person ? ?Provider:   ?Dr. Jens Som  ? ?

## 2021-08-31 DIAGNOSIS — Z79899 Other long term (current) drug therapy: Secondary | ICD-10-CM | POA: Diagnosis not present

## 2021-08-31 DIAGNOSIS — E039 Hypothyroidism, unspecified: Secondary | ICD-10-CM | POA: Diagnosis not present

## 2021-09-24 ENCOUNTER — Ambulatory Visit (HOSPITAL_COMMUNITY)
Admission: RE | Admit: 2021-09-24 | Discharge: 2021-09-24 | Disposition: A | Discharge status: Home / Self Care | Payer: Medicare HMO | Source: Ambulatory Visit | Attending: Cardiology | Admitting: Cardiology

## 2021-09-24 DIAGNOSIS — I700 Atherosclerosis of aorta: Secondary | ICD-10-CM | POA: Diagnosis not present

## 2021-09-24 DIAGNOSIS — I723 Aneurysm of iliac artery: Secondary | ICD-10-CM | POA: Diagnosis not present

## 2021-09-27 ENCOUNTER — Encounter: Payer: Self-pay | Admitting: *Deleted

## 2021-09-27 ENCOUNTER — Other Ambulatory Visit: Payer: Self-pay | Admitting: *Deleted

## 2021-09-27 DIAGNOSIS — I723 Aneurysm of iliac artery: Secondary | ICD-10-CM

## 2021-11-09 DIAGNOSIS — R079 Chest pain, unspecified: Secondary | ICD-10-CM | POA: Diagnosis not present

## 2022-01-16 DIAGNOSIS — H2513 Age-related nuclear cataract, bilateral: Secondary | ICD-10-CM | POA: Diagnosis not present

## 2022-01-16 DIAGNOSIS — H1045 Other chronic allergic conjunctivitis: Secondary | ICD-10-CM | POA: Diagnosis not present

## 2022-01-23 ENCOUNTER — Other Ambulatory Visit: Payer: Self-pay | Admitting: Cardiology

## 2022-01-29 DIAGNOSIS — Z23 Encounter for immunization: Secondary | ICD-10-CM | POA: Diagnosis not present

## 2022-04-21 DIAGNOSIS — R051 Acute cough: Secondary | ICD-10-CM | POA: Diagnosis not present

## 2022-05-20 ENCOUNTER — Other Ambulatory Visit: Payer: Self-pay | Admitting: Cardiology

## 2022-05-20 DIAGNOSIS — E782 Mixed hyperlipidemia: Secondary | ICD-10-CM

## 2022-06-06 ENCOUNTER — Telehealth: Payer: Self-pay | Admitting: Cardiology

## 2022-06-06 DIAGNOSIS — I723 Aneurysm of iliac artery: Secondary | ICD-10-CM

## 2022-06-06 NOTE — Telephone Encounter (Signed)
Patient's wife called stating her husband is to have an ultrasound done every 6 months. Last one done was in June he is past due, she would like an order to be placed.

## 2022-06-06 NOTE — Telephone Encounter (Signed)
Returned call to patients wife (okay per DPR) advised her per last Vas AAA- from 09/2021 recommends follow up in 6 months-   Lelon Perla, MD 09/24/2021  3:32 PM EDT     FU study 6 months for right iliac aneurysm Kirk Ruths    Order placed. Patient also due to see Dr. Stanford Breed back in follow up for 1 year- appointment scheduled for 3/11 with Dr. Stanford Breed. Advised her someone from scheduling will reach out about the ultrasound. Patients wife verbalized understanding.

## 2022-06-17 DIAGNOSIS — J069 Acute upper respiratory infection, unspecified: Secondary | ICD-10-CM | POA: Diagnosis not present

## 2022-06-18 ENCOUNTER — Ambulatory Visit (HOSPITAL_COMMUNITY): Admission: RE | Admit: 2022-06-18 | Payer: Medicare HMO | Source: Ambulatory Visit

## 2022-06-24 NOTE — Progress Notes (Signed)
HPI: FU coronary artery disease. Cardiac catheterization in 2007 following a myocardial infarction showed significant disease in the mid circumflex and first marginal branch. There was also significant disease in the right coronary artery. He had a 40% LAD. He had 2 stents to the circumflex, 1 stent to the obtuse marginal and 3 overlapping stents to the right coronary artery. His ejection fraction was 45% with inferior basal hypokinesis. Nuclear study 5/17 showed EF 49; inferior infarct with peri-infarct ischemia towards the apex. Carotid Dopplers August 2019 showed 1 to 39% bilateral stenosis. Echocardiogram November 2021 showed ejection fraction 50 to 37%, grade 1 diastolic dysfunction, moderate left atrial enlargement, mild aortic stenosis with mean gradient 12 mmHg, mildly dilated aortic root at 41 mm. Abdominal ultrasound March 2024 showed ectatic distal aorta extending into the bilateral common iliac arteries right greater than left with moderate to severe atherosclerosis; right common iliac artery aneurysm at 3.3 cm; referral to vascular surgery recommended.  Since last seen, he denies dyspnea, chest pain, palpitations or syncope.  He does have bilateral lower extremity claudication.  Current Outpatient Medications  Medication Sig Dispense Refill   aspirin 81 MG tablet Take 81 mg by mouth daily.     atorvastatin (LIPITOR) 80 MG tablet TAKE 1 TABLET BY MOUTH EVERY DAY 90 tablet 1   levothyroxine (SYNTHROID) 25 MCG tablet Take 25 mcg by mouth every morning.     losartan (COZAAR) 50 MG tablet Take 1 tablet (50 mg total) by mouth daily. 90 tablet 3   metoprolol succinate (TOPROL-XL) 25 MG 24 hr tablet Take 0.5 tablets (12.5 mg total) by mouth daily. 45 tablet 1   nitroGLYCERIN (NITROSTAT) 0.4 MG SL tablet Place 1 tablet (0.4 mg total) under the tongue every 5 (five) minutes as needed for chest pain. 25 tablet 3   No current facility-administered medications for this visit.     Past Medical  History:  Diagnosis Date   AAA (abdominal aortic aneurysm) (HCC)    a.08/2015 Aortic Duplex: 2.2 x 2.9 cm Infrarenal fusiform AAA.   Coronary atherosclerosis of native coronary artery [I25.10]    a. 2007 NSTEMI/PCI: severe dzs in mLCX (DES x 2), OM1 (DES x1), and RCA (DES x 3 in overlapping fashion), 40% LAD;  b. 08/2015 Myoview: EF 49%, medium defect/moderate severity in basal inferior, mid inferior, and apical inferior locations->inf infarct w/ peri-infarct ischemia--pt doing well @ f/u--med Rx.   DIZZINESS    Mixed hyperlipidemia    Right Common iliac aneurysm ([place])    a. 08/2015 Duplex: 2.5 x 2.2 cm R CIA aneurysm - stable.    Past Surgical History:  Procedure Laterality Date   CORONARY ANGIOPLASTY WITH STENT PLACEMENT  11/01/2015   LAD 40%, CFX 95%>0% w/ overlapping Taxus 2.5 x 24 mm, 2.5 x 12 mm DES, OM 90%>0% w/ Taxus 2.5 x 16 mm DES, RCA 95%>0% w/ overlapping Taxus 2.5 x 24 mm x 2 and a 2.75 x 32 mm DES, EF 45%        Social History   Socioeconomic History   Marital status: Married    Spouse name: Not on file   Number of children: Not on file   Years of education: Not on file   Highest education level: Not on file  Occupational History   Not on file  Tobacco Use   Smoking status: Former   Smokeless tobacco: Never  Substance and Sexual Activity   Alcohol use: Yes    Comment: ocassionally   Drug  use: No   Sexual activity: Not on file  Other Topics Concern   Not on file  Social History Narrative   Not on file   Social Determinants of Health   Financial Resource Strain: Not on file  Food Insecurity: Not on file  Transportation Needs: Not on file  Physical Activity: Not on file  Stress: Not on file  Social Connections: Not on file  Intimate Partner Violence: Not on file    Family History  Problem Relation Age of Onset   Coronary artery disease Father        Multiple male relatives    ROS: no fevers or chills, productive cough, hemoptysis, dysphasia,  odynophagia, melena, hematochezia, dysuria, hematuria, rash, seizure activity, orthopnea, PND, pedal edema, claudication. Remaining systems are negative.  Physical Exam: Well-developed well-nourished in no acute distress.  Skin is warm and dry.  HEENT is normal.  Neck is supple.  Chest with diminished breath sounds Cardiovascular exam is regular rate and rhythm.  Abdominal exam nontender or distended. No masses palpated. Extremities show no edema. neuro grossly intact  ECG-sinus bradycardia at a rate of 46, no ST changes.  Personally reviewed  A/P  1 coronary artery disease-patient doing well with no chest pain.  Continue medical therapy with aspirin and statin.  2 history of iliac aneurysm-3.3 cm on recent Dopplers.  He also has bilateral lower extremity claudication.  He is scheduled to see vascular surgery later this week.  3 hyperlipidemia-continue statin.  4 aortic stenosis-will arrange follow-up echocardiogram.  Mild on previous study.  5 ischemic cardiomyopathy-LV function is low normal on most recent echocardiogram.  Will continue ARB and beta-blocker.  6 hypertension-blood pressure mildly elevated.  I have asked him to follow this at home and we will increase valsartan if necessary.  Kirk Ruths, MD

## 2022-06-25 ENCOUNTER — Ambulatory Visit (HOSPITAL_COMMUNITY)
Admission: RE | Admit: 2022-06-25 | Discharge: 2022-06-25 | Disposition: A | Discharge status: Home / Self Care | Payer: Medicare HMO | Source: Ambulatory Visit | Attending: Cardiology | Admitting: Cardiology

## 2022-06-25 DIAGNOSIS — I723 Aneurysm of iliac artery: Secondary | ICD-10-CM | POA: Diagnosis not present

## 2022-06-26 ENCOUNTER — Other Ambulatory Visit: Payer: Self-pay | Admitting: *Deleted

## 2022-06-26 DIAGNOSIS — I700 Atherosclerosis of aorta: Secondary | ICD-10-CM

## 2022-06-26 DIAGNOSIS — I7143 Infrarenal abdominal aortic aneurysm, without rupture: Secondary | ICD-10-CM

## 2022-07-01 ENCOUNTER — Encounter: Payer: Self-pay | Admitting: Cardiology

## 2022-07-01 ENCOUNTER — Ambulatory Visit: Payer: Medicare HMO | Attending: Cardiology | Admitting: Cardiology

## 2022-07-01 ENCOUNTER — Other Ambulatory Visit: Payer: Self-pay | Admitting: *Deleted

## 2022-07-01 VITALS — BP 142/80 | HR 46 | Ht 72.0 in | Wt 198.6 lb

## 2022-07-01 DIAGNOSIS — I350 Nonrheumatic aortic (valve) stenosis: Secondary | ICD-10-CM | POA: Diagnosis not present

## 2022-07-01 DIAGNOSIS — I10 Essential (primary) hypertension: Secondary | ICD-10-CM

## 2022-07-01 DIAGNOSIS — E782 Mixed hyperlipidemia: Secondary | ICD-10-CM | POA: Diagnosis not present

## 2022-07-01 DIAGNOSIS — I723 Aneurysm of iliac artery: Secondary | ICD-10-CM | POA: Diagnosis not present

## 2022-07-01 DIAGNOSIS — I2510 Atherosclerotic heart disease of native coronary artery without angina pectoris: Secondary | ICD-10-CM | POA: Diagnosis not present

## 2022-07-01 NOTE — Patient Instructions (Signed)
  Testing/Procedures:  Your physician has requested that you have an echocardiogram. Echocardiography is a painless test that uses sound waves to create images of your heart. It provides your doctor with information about the size and shape of your heart and how well your heart's chambers and valves are working. This procedure takes approximately one hour. There are no restrictions for this procedure. Please do NOT wear cologne, perfume, aftershave, or lotions (deodorant is allowed). Please arrive 15 minutes prior to your appointment time. [address]   Follow-Up: At [facility], you and your health needs are our priority.  As part of our continuing mission to provide you with exceptional heart care, we have created designated Provider Care Teams.  These Care Teams include your primary Cardiologist (physician) and Advanced Practice Providers (APPs -  Physician Assistants and Nurse Practitioners) who all work together to provide you with the care you need, when you need it.  We recommend signing up for the patient portal called "MyChart".  Sign up information is provided on this After Visit Summary.  MyChart is used to connect with patients for Virtual Visits (Telemedicine).  Patients are able to view lab/test results, encounter notes, upcoming appointments, etc.  Non-urgent messages can be sent to your provider as well.   To learn more about what you can do with MyChart, go to [email or web address].    Your next appointment:   12 month(s)  Provider:   BRIAN CRENSHAW MD    

## 2022-07-03 DIAGNOSIS — Z0100 Encounter for examination of eyes and vision without abnormal findings: Secondary | ICD-10-CM | POA: Diagnosis not present

## 2022-07-04 ENCOUNTER — Ambulatory Visit (HOSPITAL_COMMUNITY)
Admission: RE | Admit: 2022-07-04 | Discharge: 2022-07-04 | Disposition: A | Discharge status: Home / Self Care | Payer: Medicare HMO | Source: Ambulatory Visit | Attending: Vascular Surgery | Admitting: Vascular Surgery

## 2022-07-04 DIAGNOSIS — I723 Aneurysm of iliac artery: Secondary | ICD-10-CM | POA: Insufficient documentation

## 2022-07-04 NOTE — Progress Notes (Signed)
Office Note     CC:  3.4cm AAA, 3.80mm Rt CIA Requesting Provider:  Jonathon Jordan, MD  HPI: Grant Shea is a 77 y.o. (1945/11/14) male presenting at the request of .Jonathon Jordan, MD for AAA, right CIA aneurysms.  On exam today, Grant Shea was doing well, accompanied by his wife for 49 years.  Grant Shea is a native of [facility], but is lifting the greens.  For the majority of his life.  His wife is from [place].  He worked for [employer] for over 30 years.  His father died of an aneurysm and his mother had her aorta replaced roughly 40 years ago.  Grant Shea has known about his aneurysm for quite some time, and was undergoing every 6 month follow-ups with Dr. Stephanie Acre.  Recent imaging demonstrated significant growth in both aneurysms prompting referral to vascular surgery.  Denies abdominal pain, back pain, pelvic pain.  He has no symptoms of claudication, ischemic rest pain, tissue loss in the feet. 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  The pt is  on a statin for cholesterol management.  The pt is  on a daily aspirin.   Other AC:  - The pt is  on medication for hypertension.   The pt is not diabetic.  Tobacco hx:  former  Past Medical History:  Diagnosis Date   AAA (abdominal aortic aneurysm) (HCC)    a.08/2015 Aortic Duplex: 2.2 x 2.9 cm Infrarenal fusiform AAA.   Coronary atherosclerosis of native coronary artery [I25.10]    a. 2007 NSTEMI/PCI: severe dzs in mLCX (DES x 2), OM1 (DES x1), and RCA (DES x 3 in overlapping fashion), 40% LAD;  b. 08/2015 Myoview: EF 49%, medium defect/moderate severity in basal inferior, mid inferior, and apical inferior locations->inf infarct w/ peri-infarct ischemia--pt doing well @ f/u--med Rx.   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)    a. 08/2015 Duplex: 2.5 x 2.2 cm R CIA aneurysm - stable.    Past Surgical History:  Procedure Laterality Date   CORONARY ANGIOPLASTY WITH STENT PLACEMENT  11/01/2015    LAD 40%, CFX 95%>0% w/ overlapping Taxus 2.5 x 24 mm, 2.5 x 12 mm DES, OM 90%>0% w/ Taxus 2.5 x 16 mm DES, RCA 95%>0% w/ overlapping Taxus 2.5 x 24 mm x 2 and a 2.75 x 32 mm DES, EF 45%        Social History   Socioeconomic History   Marital status: Married    Spouse name: Not on file   Number of children: Not on file   Years of education: Not on file   Highest education level: Not on file  Occupational History   Not on file  Tobacco Use   Smoking status: Former   Smokeless tobacco: Never  Substance and Sexual Activity   Alcohol use: Yes    Comment: ocassionally   Drug use: No   Sexual activity: Not on file  Other Topics Concern   Not on file  Social History Narrative   Not on file   Social Determinants of Health   Financial Resource Strain: Not on file  Food Insecurity: Not on file  Transportation Needs: Not on file  Physical Activity: Not on file  Stress: Not on file  Social Connections: Not on file  Intimate Partner Violence: Not on file   Family History  Problem Relation Age of Onset   Coronary artery disease Father  Multiple male relatives    Current Outpatient Medications  Medication Sig Dispense Refill   aspirin 81 MG tablet Take 81 mg by mouth daily.     atorvastatin (LIPITOR) 80 MG tablet TAKE 1 TABLET BY MOUTH EVERY DAY 90 tablet 1   levothyroxine (SYNTHROID) 25 MCG tablet Take 25 mcg by mouth every morning.     losartan (COZAAR) 50 MG tablet Take 1 tablet (50 mg total) by mouth daily. 90 tablet 3   metoprolol succinate (TOPROL-XL) 25 MG 24 hr tablet Take 0.5 tablets (12.5 mg total) by mouth daily. 45 tablet 1   nitroGLYCERIN (NITROSTAT) 0.4 MG SL tablet Place 1 tablet (0.4 mg total) under the tongue every 5 (five) minutes as needed for chest pain. 25 tablet 3   No current facility-administered medications for this visit.    Allergies  Allergen Reactions   Penicillins     Unknown      REVIEW OF SYSTEMS:  [X]  denotes positive finding, [ ]   denotes negative finding Cardiac  Comments:  Chest pain or chest pressure:    Shortness of breath upon exertion:    Short of breath when lying flat:    Irregular heart rhythm:        Vascular    Pain in calf, thigh, or hip brought on by ambulation:    Pain in feet at night that wakes you up from your sleep:     Blood clot in your veins:    Leg swelling:         Pulmonary    Oxygen at home:    Productive cough:     Wheezing:         Neurologic    Sudden weakness in arms or legs:     Sudden numbness in arms or legs:     Sudden onset of difficulty speaking or slurred speech:    Temporary loss of vision in one eye:     Problems with dizziness:         Gastrointestinal    Blood in stool:     Vomited blood:         Genitourinary    Burning when urinating:     Blood in urine:        Psychiatric    Major depression:         Hematologic    Bleeding problems:    Problems with blood clotting too easily:        Skin    Rashes or ulcers:        Constitutional    Fever or chills:      PHYSICAL EXAMINATION:  There were no vitals filed for this visit.  General:  WDWN in NAD; vital signs documented above Gait: Not observed HENT: WNL, normocephalic Pulmonary: normal non-labored breathing , without wheezing Cardiac: regular HR Abdomen: soft, NT, no masses Skin: without rashes Vascular Exam/Pulses:  Right Left  Radial 2+ (normal) 2+ (normal)  Ulnar    Femoral 2+ (normal) 2+ (normal)  Popliteal    DP 2+ (normal) 2+ (normal)  PT     Extremities: without ischemic changes, without Gangrene , without cellulitis; without open wounds;  Musculoskeletal: no muscle wasting or atrophy  Neurologic: A&O X 3;  No focal weakness or paresthesias are detected Psychiatric:  The pt has Normal affect.   Non-Invasive Vascular Imaging:   ABDOMINAL AORTA STUDY  Patient Name:  Grant Shea  Date of Exam:   06/25/2022 Medical Rec #: [id number]  Accession #:     [id number] Date of Birth: [birth year]          Patient Gender: M Patient Age:   8 years Exam Location:  [facility] Procedure:      VAS [place] AAA DUPLEX Referring Phys: BRIAN CRENSHAW   --------------------------------------------------------------------------- -----   Indications: Follow up to right common iliac artery aneurysm. Patient has long              history of low back pain with arthritis.  Risk Factors: Hyperlipidemia, past history of smoking, coronary artery disease.  Limitations: Air/bowel gas.    Comparison Study: Prior aorta duplex exam on 09/24/2021 showed distal aorta at 2.7                   x 2.8 cm with right CIA 2.9 x 2.7 cm.  Performing Technologist: Salvadore Dom RVT, RDCS (AE), RDMS    Examination Guidelines: A complete evaluation includes B-mode imaging, spectral Doppler, color Doppler, and power Doppler as needed of all accessible portions of each vessel. Bilateral testing is considered an integral part of a complete examination. Limited examinations for reoccurring indications may be performed as noted.    Abdominal Aorta Findings: +-------------+-------+----------+----------+----------+--------+--------+ Location     AP (cm)Trans (cm)PSV (cm/s)Waveform  ThrombusComments +-------------+-------+----------+----------+----------+--------+--------+ Proximal     2.30   2.40      57        biphasic                   +-------------+-------+----------+----------+----------+--------+--------+ Mid          2.80   3.00      108       biphasic                   +-------------+-------+----------+----------+----------+--------+--------+ Distal       3.10   3.40      69        biphasic          ectatic  +-------------+-------+----------+----------+----------+--------+--------+ RT CIA Prox  3.0    3.3       28        monophasic        ectatic  +-------------+-------+----------+----------+----------+--------+--------+ RT  CIA Mid                    75        biphasic                   +-------------+-------+----------+----------+----------+--------+--------+ RT CIA Distal                 127       biphasic                   +-------------+-------+----------+----------+----------+--------+--------+ RT EIA Prox  1.2    1.1       93        biphasic                   +-------------+-------+----------+----------+----------+--------+--------+ RT EIA Distal                 135       triphasic                  +-------------+-------+----------+----------+----------+--------+--------+ LT CIA Prox  1.6    1.9       52        biphasic          ectatic  +-------------+-------+----------+----------+----------+--------+--------+  LT CIA Mid                    97        biphasic                   +-------------+-------+----------+----------+----------+--------+--------+ LT CIA Distal                 98        biphasic                   +-------------+-------+----------+----------+----------+--------+--------+ LT EIA Prox  1.1    1.0       85        biphasic                   +-------------+-------+----------+----------+----------+--------+--------+ LT EIA Distal                 66        biphasic                   +-------------+-------+----------+----------+----------+--------+--------+   +-------+-----------+-----------+------------+------------+  ABI/TBIToday's ABIToday's TBIPrevious ABIPrevious TBI  +-------+-----------+-----------+------------+------------+  Right 0.99       0.88                                 +-------+-----------+-----------+------------+------------+  Left  0.76       0.73                                 +-------+-----------+-----------+------------+------------+       ASSESSMENT/PLAN: Grant Shea is a 77 y.o. male presenting with abdominal aortic ultrasound findings demonstrating growth and his AAA  to 3.4 cm, right CIA aneurysm to 3.3 cm.  The size is 2.9cm, 2.9cm respectively.  On exam, Henning was doing well, he is asymptomatic.  We had a long discussion regarding natural history of aneurysmal disease, most importantly risk of rupture.  SVS guidelines recommend repairing aneurysms greater than 5.5 cm for aortic aneurysms, greater than 3.5 cm, iliac artery aneurysms.  Rapid growth is considered 1 cm at a year.  Bili has grown roughly half a centimeter in 9 months.  Diamond would benefit from CT angiogram abdomen pelvis with runoff in an effort to assess the abdominal aortic aneurysm, right common iliac artery aneurysm.  The runoff would also allow [place] to assess for popliteal artery aneurysms as these are more common in individuals with aortic and iliac artery aneurysms.  Discussed the signs and symptoms of rupture.  I asked him to call [phone] immediately should any of these occur. My plan is to see him in 2 to 4 weeks for follow-up to discuss CT findings.   Broadus John, MD [facility] [phone] Total time of patient care including pre-visit research, consultation, and documentation greater than 45 minutes

## 2022-07-05 ENCOUNTER — Ambulatory Visit: Payer: Medicare HMO | Admitting: Vascular Surgery

## 2022-07-05 ENCOUNTER — Encounter: Payer: Self-pay | Admitting: Vascular Surgery

## 2022-07-05 VITALS — BP 119/72 | HR 56 | Temp 97.9°F | Resp 20 | Ht 72.0 in | Wt 195.8 lb

## 2022-07-05 DIAGNOSIS — I723 Aneurysm of iliac artery: Secondary | ICD-10-CM | POA: Diagnosis not present

## 2022-07-05 DIAGNOSIS — I7143 Infrarenal abdominal aortic aneurysm, without rupture: Secondary | ICD-10-CM | POA: Diagnosis not present

## 2022-07-05 LAB — VAS US ABI WITH/WO TBI
Left ABI: 0.76
Right ABI: 0.99

## 2022-07-11 ENCOUNTER — Other Ambulatory Visit: Payer: Self-pay

## 2022-07-11 DIAGNOSIS — I7143 Infrarenal abdominal aortic aneurysm, without rupture: Secondary | ICD-10-CM

## 2022-07-11 DIAGNOSIS — I723 Aneurysm of iliac artery: Secondary | ICD-10-CM

## 2022-07-18 ENCOUNTER — Other Ambulatory Visit: Payer: Self-pay | Admitting: Cardiology

## 2022-07-30 ENCOUNTER — Ambulatory Visit (HOSPITAL_COMMUNITY): Payer: Medicare HMO | Attending: Cardiology

## 2022-07-30 DIAGNOSIS — I77810 Thoracic aortic ectasia: Secondary | ICD-10-CM | POA: Diagnosis not present

## 2022-07-30 DIAGNOSIS — I517 Cardiomegaly: Secondary | ICD-10-CM

## 2022-07-30 DIAGNOSIS — I350 Nonrheumatic aortic (valve) stenosis: Secondary | ICD-10-CM | POA: Insufficient documentation

## 2022-07-30 LAB — ECHOCARDIOGRAM COMPLETE
AR max vel: 1.27 cm2
AV Area VTI: 1.26 cm2
AV Area mean vel: 1.27 cm2
AV Mean grad: 12.3 mmHg
AV Peak grad: 23.7 mmHg
Ao pk vel: 2.43 m/s
Area-P 1/2: 3.08 cm2
S' Lateral: 3.4 cm

## 2022-07-31 ENCOUNTER — Other Ambulatory Visit: Payer: Self-pay | Admitting: *Deleted

## 2022-07-31 DIAGNOSIS — I77810 Thoracic aortic ectasia: Secondary | ICD-10-CM

## 2022-08-09 DIAGNOSIS — Z125 Encounter for screening for malignant neoplasm of prostate: Secondary | ICD-10-CM | POA: Diagnosis not present

## 2022-08-09 DIAGNOSIS — Z1211 Encounter for screening for malignant neoplasm of colon: Secondary | ICD-10-CM | POA: Diagnosis not present

## 2022-08-09 DIAGNOSIS — I6529 Occlusion and stenosis of unspecified carotid artery: Secondary | ICD-10-CM | POA: Diagnosis not present

## 2022-08-09 DIAGNOSIS — I723 Aneurysm of iliac artery: Secondary | ICD-10-CM | POA: Diagnosis not present

## 2022-08-09 DIAGNOSIS — I10 Essential (primary) hypertension: Secondary | ICD-10-CM | POA: Diagnosis not present

## 2022-08-09 DIAGNOSIS — I722 Aneurysm of renal artery: Secondary | ICD-10-CM | POA: Diagnosis not present

## 2022-08-09 DIAGNOSIS — E039 Hypothyroidism, unspecified: Secondary | ICD-10-CM | POA: Diagnosis not present

## 2022-08-09 DIAGNOSIS — Z79899 Other long term (current) drug therapy: Secondary | ICD-10-CM | POA: Diagnosis not present

## 2022-08-09 DIAGNOSIS — I7143 Infrarenal abdominal aortic aneurysm, without rupture: Secondary | ICD-10-CM | POA: Diagnosis not present

## 2022-08-09 DIAGNOSIS — Z0000 Encounter for general adult medical examination without abnormal findings: Secondary | ICD-10-CM | POA: Diagnosis not present

## 2022-08-09 DIAGNOSIS — E7800 Pure hypercholesterolemia, unspecified: Secondary | ICD-10-CM | POA: Diagnosis not present

## 2022-08-09 DIAGNOSIS — I25119 Atherosclerotic heart disease of native coronary artery with unspecified angina pectoris: Secondary | ICD-10-CM | POA: Diagnosis not present

## 2022-08-13 ENCOUNTER — Ambulatory Visit (HOSPITAL_COMMUNITY)
Admission: RE | Admit: 2022-08-13 | Discharge: 2022-08-13 | Disposition: A | Discharge status: Home / Self Care | Payer: Medicare HMO | Source: Ambulatory Visit | Attending: Vascular Surgery | Admitting: Vascular Surgery

## 2022-08-13 DIAGNOSIS — I719 Aortic aneurysm of unspecified site, without rupture: Secondary | ICD-10-CM | POA: Diagnosis not present

## 2022-08-13 DIAGNOSIS — I77810 Thoracic aortic ectasia: Secondary | ICD-10-CM | POA: Diagnosis not present

## 2022-08-13 DIAGNOSIS — I7143 Infrarenal abdominal aortic aneurysm, without rupture: Secondary | ICD-10-CM

## 2022-08-13 DIAGNOSIS — I723 Aneurysm of iliac artery: Secondary | ICD-10-CM

## 2022-08-13 MED ORDER — IOHEXOL 350 MG/ML SOLN
100.0000 mL | Freq: Once | INTRAVENOUS | Status: AC | PRN
  Administered 2022-08-13: 100 mL via INTRAVENOUS

## 2022-08-15 NOTE — Progress Notes (Signed)
Office Note    HPI: Grant Shea is a 77 y.o. (1946/02/10) male presenting at the request of .[name], MD for AAA, right CIA aneurysm, AAA.  On exam today, Grant Shea was doing well, accompanied by his wife for 49 years.  Grant Shea is a native of [facility], but has lived in [place] for the majority of his life.  His wife is from [place].  He worked for [employer] for over 30 years.  His father died of an aneurysm and his mother had her aorta replaced roughly 40 years ago.  Grant Shea has known about his aneurysm for quite some time, and was undergoing every 6 month follow-ups with Dr. Paulino Rily.  Recent imaging demonstrated significant growth in both aneurysms prompting referral to vascular surgery. Since last visit, Grant Shea underwent CT angio abdomen pelvis to further define the extent of growth.  He presents today to discuss these findings.  Denies abdominal pain, back pain, pelvic pain.  He has no symptoms of claudication, ischemic rest pain, tissue loss in the feet. I work with his niece in the operating room.  The pt is  on a statin for cholesterol management.  The pt is  on a daily aspirin.   Other AC:  - The pt is  on medication for hypertension.   The pt is not diabetic.  Tobacco hx:  former  Past Medical History:  Diagnosis Date   AAA (abdominal aortic aneurysm) (HCC)    a.08/2015 Aortic Duplex: 2.2 x 2.9 cm Infrarenal fusiform AAA.   Coronary atherosclerosis of native coronary artery [I25.10]    a. 2007 NSTEMI/PCI: severe dzs in mLCX (DES x 2), OM1 (DES x1), and RCA (DES x 3 in overlapping fashion), 40% LAD;  b. 08/2015 Myoview: EF 49%, medium defect/moderate severity in basal inferior, mid inferior, and apical inferior locations->inf infarct w/ peri-infarct ischemia--pt doing well @ f/u--med Rx.   DIZZINESS    Mixed hyperlipidemia    Right Common iliac aneurysm (HCC)    a. 08/2015 Duplex: 2.5 x 2.2 cm R CIA aneurysm - stable.    Past Surgical History:  Procedure  Laterality Date   CORONARY ANGIOPLASTY WITH STENT PLACEMENT  11/01/2015   LAD 40%, CFX 95%>0% w/ overlapping Taxus 2.5 x 24 mm, 2.5 x 12 mm DES, OM 90%>0% w/ Taxus 2.5 x 16 mm DES, RCA 95%>0% w/ overlapping Taxus 2.5 x 24 mm x 2 and a 2.75 x 32 mm DES, EF 45%        Social History   Socioeconomic History   Marital status: Married    Spouse name: Not on file   Number of children: Not on file   Years of education: Not on file   Highest education level: Not on file  Occupational History   Not on file  Tobacco Use   Smoking status: Former   Smokeless tobacco: Never  Substance and Sexual Activity   Alcohol use: Yes    Comment: ocassionally   Drug use: No   Sexual activity: Not on file  Other Topics Concern   Not on file  Social History Narrative   Not on file   Social Determinants of Health   Financial Resource Strain: Not on file  Food Insecurity: Not on file  Transportation Needs: Not on file  Physical Activity: Not on file  Stress: Not on file  Social Connections: Not on file  Intimate Partner Violence: Not on file   Family History  Problem Relation Age of Onset   Coronary  artery disease Father        Multiple male relatives    Current Outpatient Medications  Medication Sig Dispense Refill   aspirin 81 MG tablet Take 81 mg by mouth daily.     atorvastatin (LIPITOR) 80 MG tablet TAKE 1 TABLET BY MOUTH EVERY DAY 90 tablet 1   levothyroxine (SYNTHROID) 25 MCG tablet Take 25 mcg by mouth every morning.     losartan (COZAAR) 50 MG tablet Take 1 tablet (50 mg total) by mouth daily. 90 tablet 3   metoprolol succinate (TOPROL-XL) 25 MG 24 hr tablet TAKE 1/2 TABLET BY MOUTH EVERY DAY 45 tablet 1   nitroGLYCERIN (NITROSTAT) 0.4 MG SL tablet Place 1 tablet (0.4 mg total) under the tongue every 5 (five) minutes as needed for chest pain. 25 tablet 3   No current facility-administered medications for this visit.    Allergies  Allergen Reactions   Penicillins     Patient  states he has not had reaction to pcn but other family members have.     REVIEW OF SYSTEMS:  [X]  denotes positive finding, [ ]  denotes negative finding Cardiac  Comments:  Chest pain or chest pressure:    Shortness of breath upon exertion:    Short of breath when lying flat:    Irregular heart rhythm:        Vascular    Pain in calf, thigh, or hip brought on by ambulation:    Pain in feet at night that wakes you up from your sleep:     Blood clot in your veins:    Leg swelling:         Pulmonary    Oxygen at home:    Productive cough:     Wheezing:         Neurologic    Sudden weakness in arms or legs:     Sudden numbness in arms or legs:     Sudden onset of difficulty speaking or slurred speech:    Temporary loss of vision in one eye:     Problems with dizziness:         Gastrointestinal    Blood in stool:     Vomited blood:         Genitourinary    Burning when urinating:     Blood in urine:        Psychiatric    Major depression:         Hematologic    Bleeding problems:    Problems with blood clotting too easily:        Skin    Rashes or ulcers:        Constitutional    Fever or chills:      PHYSICAL EXAMINATION:  There were no vitals filed for this visit.  General:  WDWN in NAD; vital signs documented above Gait: Not observed HENT: WNL, normocephalic Pulmonary: normal non-labored breathing , without wheezing Cardiac: regular HR Abdomen: soft, NT, no masses Skin: without rashes Vascular Exam/Pulses:  Right Left  Radial 2+ (normal) 2+ (normal)  Ulnar    Femoral 2+ (normal) 2+ (normal)  Popliteal    DP 2+ (normal) 2+ (normal)  PT     Extremities: without ischemic changes, without Gangrene , without cellulitis; without open wounds;  Musculoskeletal: no muscle wasting or atrophy  Neurologic: A&O X 3;  No focal weakness or paresthesias are detected Psychiatric:  The pt has Normal affect.   Non-Invasive Vascular Imaging:   ABDOMINAL AORTA  STUDY  Patient Name:  Grant Shea  Date of Exam:   06/25/2022 Medical Rec #: [id number]          Accession #:    [id number] Date of Birth: [birth year]          Patient Gender: M Patient Age:   84 years Exam Location:  [facility] Procedure:      VAS [place] AAA DUPLEX Referring Phys: BRIAN CRENSHAW   --------------------------------------------------------------------------- -----   Indications: Follow up to right common iliac artery aneurysm. Patient has long              history of low back pain with arthritis.  Risk Factors: Hyperlipidemia, past history of smoking, coronary artery disease.  Limitations: Air/bowel gas.    Comparison Study: Prior aorta duplex exam on 09/24/2021 showed distal aorta at 2.7                   x 2.8 cm with right CIA 2.9 x 2.7 cm.  Performing Technologist: Carlos American RVT, RDCS (AE), RDMS    Examination Guidelines: A complete evaluation includes B-mode imaging, spectral Doppler, color Doppler, and power Doppler as needed of all accessible portions of each vessel. Bilateral testing is considered an integral part of a complete examination. Limited examinations for reoccurring indications may be performed as noted.    Abdominal Aorta Findings: +-------------+-------+----------+----------+----------+--------+--------+ Location     AP (cm)Trans (cm)PSV (cm/s)Waveform  ThrombusComments +-------------+-------+----------+----------+----------+--------+--------+ Proximal     2.30   2.40      57        biphasic                   +-------------+-------+----------+----------+----------+--------+--------+ Mid          2.80   3.00      108       biphasic                   +-------------+-------+----------+----------+----------+--------+--------+ Distal       3.10   3.40      69        biphasic          ectatic  +-------------+-------+----------+----------+----------+--------+--------+ RT CIA Prox  3.0    3.3       28         monophasic        ectatic  +-------------+-------+----------+----------+----------+--------+--------+ RT CIA Mid                    75        biphasic                   +-------------+-------+----------+----------+----------+--------+--------+ RT CIA Distal                 127       biphasic                   +-------------+-------+----------+----------+----------+--------+--------+ RT EIA Prox  1.2    1.1       93        biphasic                   +-------------+-------+----------+----------+----------+--------+--------+ RT EIA Distal                 135       triphasic                  +-------------+-------+----------+----------+----------+--------+--------+ LT CIA Prox  1.6    1.9  52        biphasic          ectatic  +-------------+-------+----------+----------+----------+--------+--------+ LT CIA Mid                    97        biphasic                   +-------------+-------+----------+----------+----------+--------+--------+ LT CIA Distal                 98        biphasic                   +-------------+-------+----------+----------+----------+--------+--------+ LT EIA Prox  1.1    1.0       85        biphasic                   +-------------+-------+----------+----------+----------+--------+--------+ LT EIA Distal                 66        biphasic                   +-------------+-------+----------+----------+----------+--------+--------+   +-------+-----------+-----------+------------+------------+  ABI/TBIToday's ABIToday's TBIPrevious ABIPrevious TBI  +-------+-----------+-----------+------------+------------+  Right 0.99       0.88                                 +-------+-----------+-----------+------------+------------+  Left  0.76       0.73                                 +-------+-----------+-----------+------------+------------+    CTA    1. Infrarenal  abdominal aortic aneurysm measuring 3.1 x 2.9 cm. Recommend follow-up every 3 years. This recommendation follows ACR consensus guidelines: White Paper of the ACR Incidental Findings Committee II on Vascular Findings. J Am Coll Radiol 2013; 10:789-794. 2. Right common iliac artery aneurysm measuring 2.7 x 2.6 cm.   ASSESSMENT/PLAN: Grant Shea is a 77 y.o. male presenting with abdominal aortic ultrasound findings demonstrating growth and his AAA to 3.4 cm, right CIA aneurysm to 3.3 cm.    CTA demonstrated AAA 3.1 cm, right CIA 2.7 cm.  Neither of these meet size criteria for repair.  SVS guidelines recommend 2 to 3-year follow-up for AAA, and 6 months to yearly follow-up for CIA aneurysm.  My plan is to see him in 6 months with repeat duplex ultrasound.  No indication for repair at this time.    Discussed the signs and symptoms of rupture.  I asked him to call [phone] immediately should any of these occur. My plan is to see him in 2 to 4 weeks for follow-up to discuss CT findings.   [name], MD [facility] [phone] Total time of patient care including pre-visit research, consultation, and documentation greater than 45 minutes

## 2022-08-16 ENCOUNTER — Encounter: Payer: Self-pay | Admitting: Vascular Surgery

## 2022-08-16 ENCOUNTER — Other Ambulatory Visit: Payer: Self-pay

## 2022-08-16 ENCOUNTER — Ambulatory Visit: Payer: Medicare HMO | Admitting: Vascular Surgery

## 2022-08-16 VITALS — BP 175/84 | HR 48 | Temp 97.8°F | Resp 20 | Ht 72.0 in | Wt 201.0 lb

## 2022-08-16 DIAGNOSIS — I723 Aneurysm of iliac artery: Secondary | ICD-10-CM

## 2022-08-16 DIAGNOSIS — I7143 Infrarenal abdominal aortic aneurysm, without rupture: Secondary | ICD-10-CM | POA: Diagnosis not present

## 2022-09-01 NOTE — Progress Notes (Unsigned)
[address]       [place] [address]             [phone]           [facility] Medical Record #[id number] Date of Birth: Nov 05, 1945  Grant Sparrow, MD [name], MD  Chief Complaint:  Asymptomatic ascending aortic aneurysm  History of Present Illness:     77 yo male with previous MI and with PCI stenting has been followed by cardiology and recent echo with change in ascending aortic dimensions from 4.1 to 4.8cm. Pt had CTA of chest and confirmed 4.7cm ascending aortic aneurysm (I measured closer to 4.6cm). 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 Pt has had father with abdominal aneurysm operated on and died of complications 4 days later resembling MI. Pt has no CP and echo also demonstrated improvement in EF. Pt here to discuss management of his aortic aneurysm      Past Medical History:  Diagnosis Date   AAA (abdominal aortic aneurysm) (HCC)    a.08/2015 Aortic Duplex: 2.2 x 2.9 cm Infrarenal fusiform AAA.   Coronary atherosclerosis of native coronary artery [I25.10]    a. 2007 NSTEMI/PCI: severe dzs in mLCX (DES x 2), OM1 (DES x1), and RCA (DES x 3 in overlapping fashion), 40% LAD;  b. 08/2015 Myoview: EF 49%, medium defect/moderate severity in basal inferior, mid inferior, and apical inferior locations->inf infarct w/ peri-infarct ischemia--pt doing well @ f/u--med Rx.   DIZZINESS    Mixed hyperlipidemia    Right Common iliac aneurysm (HCC)    a. 08/2015 Duplex: 2.5 x 2.2 cm R CIA aneurysm - stable.    Past Surgical History:  Procedure Laterality Date   CORONARY ANGIOPLASTY WITH STENT PLACEMENT  11/01/2015   LAD 40%, CFX 95%>0% w/ overlapping Taxus 2.5 x 24 mm, 2.5 x 12 mm DES, OM 90%>0% w/ Taxus 2.5 x 16 mm DES, RCA 95%>0% w/ overlapping Taxus 2.5 x 24 mm x 2 and a 2.75 x 32 mm DES, EF 45%        Social History   Tobacco Use  Smoking Status Former  Smokeless  Tobacco Never    Social History   Substance and Sexual Activity  Alcohol Use Yes   Comment: ocassionally    Social History   Socioeconomic History   Marital status: Married    Spouse name: Not on file   Number of children: Not on file   Years of education: Not on file   Highest education level: Not on file  Occupational History   Not on file  Tobacco Use   Smoking status: Former   Smokeless tobacco: Never  Substance and Sexual Activity   Alcohol use: Yes    Comment: ocassionally   Drug use: No   Sexual activity: Not on file  Other Topics Concern   Not on file  Social History Narrative   Not on file   Social Determinants of Health   Financial Resource Strain: Not on file  Food Insecurity: Not on file  Transportation Needs: Not on file  Physical Activity: Not on file  Stress: Not on file  Social Connections: Not on file  Intimate Partner Violence: Not on file    Allergies  Allergen Reactions   Penicillins     Patient states he has not had reaction to pcn but other family members have.    Current  Outpatient Medications  Medication Sig Dispense Refill   aspirin 81 MG tablet Take 81 mg by mouth daily.     atorvastatin (LIPITOR) 80 MG tablet TAKE 1 TABLET BY MOUTH EVERY DAY 90 tablet 1   levothyroxine (SYNTHROID) 25 MCG tablet Take 25 mcg by mouth every morning.     losartan (COZAAR) 50 MG tablet Take 1 tablet (50 mg total) by mouth daily. 90 tablet 3   metoprolol succinate (TOPROL-XL) 25 MG 24 hr tablet TAKE 1/2 TABLET BY MOUTH EVERY DAY 45 tablet 1   nitroGLYCERIN (NITROSTAT) 0.4 MG SL tablet Place 1 tablet (0.4 mg total) under the tongue every 5 (five) minutes as needed for chest pain. 25 tablet 3   No current facility-administered medications for this visit.     Family History  Problem Relation Age of Onset   Coronary artery disease Father        Multiple male relatives       Physical Exam: Lungs: clear Card: RR  Ext: no edema Neuro: alert no  focal deficits     Diagnostic Studies & Laboratory data: I have personally reviewed the following studies and agree with the findings   TTE (07/2022) IMPRESSIONS     1. Left ventricular ejection fraction, by estimation, is 50 to 55%. The  left ventricle has low normal function. The left ventricle demonstrates  regional wall motion abnormalities (see scoring diagram/findings for  description). Left ventricular diastolic   parameters are indeterminate.   2. Right ventricular systolic function is normal. The right ventricular  size is moderately enlarged. There is normal pulmonary artery systolic  pressure.   3. Left atrial size was mildly dilated.   4. The mitral valve is normal in structure. Trivial mitral valve  regurgitation. No evidence of mitral stenosis.   5. The aortic valve is calcified. There is moderate calcification of the  aortic valve. There is mild thickening of the aortic valve. Aortic valve  regurgitation is not visualized. Mild aortic valve stenosis. Aortic valve  mean gradient measures 12.3 mmHg.   6. Aortic dilatation noted. There is moderate dilatation of the ascending  aorta, measuring 48 mm.   7. The inferior vena cava is normal in size with greater than 50%  respiratory variability, suggesting right atrial pressure of 3 mmHg.   Comparison(s): Changes from prior study are noted. Prior ascending aorta  measured 4.1 cm, now measured 4.8 cm.   Conclusion(s)/Recommendation(s): Moderate dilation of ascending aorta,  mild aortic stenosis.   FINDINGS   Left Ventricle: Left ventricular ejection fraction, by estimation, is 50  to 55%. The left ventricle has low normal function. The left ventricle  demonstrates regional wall motion abnormalities. The left ventricular  internal cavity size was normal in  size. There is no left ventricular hypertrophy. Left ventricular diastolic  parameters are indeterminate.   Right Ventricle: The right ventricular size is  moderately enlarged. Right  vetricular wall thickness was not well visualized. Right ventricular  systolic function is normal. There is normal pulmonary artery systolic  pressure. The tricuspid regurgitant  velocity is 1.99 m/s, and with an assumed right atrial pressure of 3 mmHg,  the estimated right ventricular systolic pressure is 18.8 mmHg.   Left Atrium: Left atrial size was mildly dilated.   Right Atrium: Right atrial size was normal in size.   Pericardium: There is no evidence of pericardial effusion.   Mitral Valve: The mitral valve is normal in structure. Trivial mitral  valve regurgitation. No evidence of mitral  valve stenosis.   Tricuspid Valve: The tricuspid valve is normal in structure. Tricuspid  valve regurgitation is trivial. No evidence of tricuspid stenosis.   Aortic Valve: RCC motion is restricted. The aortic valve is calcified.  There is moderate calcification of the aortic valve. There is mild  thickening of the aortic valve. Aortic valve regurgitation is not  visualized. Mild aortic stenosis is present.  Aortic valve mean gradient measures 12.3 mmHg. Aortic valve peak gradient  measures 23.7 mmHg. Aortic valve area, by VTI measures 1.26 cm.   Pulmonic Valve: The pulmonic valve was not well visualized. Pulmonic valve  regurgitation is trivial. No evidence of pulmonic stenosis.   Aorta: Aortic dilatation noted. There is moderate dilatation of the  ascending aorta, measuring 48 mm.   Venous: The inferior vena cava is normal in size with greater than 50%  respiratory variability, suggesting right atrial pressure of 3 mmHg.   IAS/Shunts: The atrial septum is grossly normal.     LEFT VENTRICLE  PLAX 2D  LVIDd:         4.80 cm   Diastology  LVIDs:         3.40 cm   LV e' medial:    8.38 cm/s  LV PW:         0.90 cm   LV E/e' medial:  6.5  LV IVS:        0.90 cm   LV e' lateral:   8.59 cm/s  LVOT diam:     2.40 cm   LV E/e' lateral: 6.4  LV SV:         80   LV SV Index:   38  LVOT Area:     4.52 cm                             3D Volume EF:                           3D EF:        64 %                           LV EDV:       167 ml                           LV ESV:       60 ml                           LV SV:        107 ml   RIGHT VENTRICLE  RV Basal diam:  4.80 cm  RV Mid diam:    3.80 cm  RV S prime:     10.00 cm/s  TAPSE (M-mode): 2.4 cm   LEFT ATRIUM             Index        RIGHT ATRIUM           Index  LA diam:        3.50 cm 1.66 cm/m   RA Area:     18.00 cm  LA Vol (A2C):   68.9 ml 32.63 ml/m  RA Volume:   52.90 ml  25.05 ml/m  LA Vol (A4C):   29.1 ml 13.78 ml/m  LA Biplane Vol: 44.9 ml 21.27 ml/m   AORTIC VALVE  AV Area (Vmax):    1.27 cm  AV Area (Vmean):   1.27 cm  AV Area (VTI):     1.26 cm  AV Vmax:           243.33 cm/s  AV Vmean:          161.667 cm/s  AV VTI:            0.635 m  AV Peak Grad:      23.7 mmHg  AV Mean Grad:      12.3 mmHg  LVOT Vmax:         68.40 cm/s  LVOT Vmean:        45.400 cm/s  LVOT VTI:          0.177 m  LVOT/AV VTI ratio: 0.28    AORTA  Ao Root diam: 4.10 cm  Ao Asc diam:  4.80 cm   MITRAL VALVE               TRICUSPID VALVE  MV Area (PHT): 3.08 cm    TR Peak grad:   15.8 mmHg  MV Decel Time: 246 msec    TR Vmax:        199.00 cm/s  MV E velocity: 54.70 cm/s  MV A velocity: 77.70 cm/s  SHUNTS  MV E/A ratio:  0.70        Systemic VTI:  0.18 m                             Systemic Diam: 2.40 cm    Recent Radiology Findings:   CTA CHEST (07/2022)   FINDINGS: Cardiovascular: Heart size is within normal limits. Extensive coronary artery calcifications are present.   Aortic valve leaflet calcifications are present. Sinus of Valsalva measures 3.9 x 3.4 x 3.8 cm. No significant dilatation of the sinotubular junction which measures 3.5 x 3.1 cm.   Aneurysmal dilatation of the mid ascending thoracic aorta measuring up to 4.7 x 4.5 cm.   Aortic arch and descending thoracic  aorta is normal in caliber.   Mediastinum/Nodes: No enlarged mediastinal, hilar, or axillary lymph nodes. Thyroid gland, trachea, and esophagus demonstrate no significant findings.   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 Lungs are otherwise clear.   Upper Abdomen: Nonspecific 8 mm focus of enhancement in the dome of the right hepatic lobe (image 112, series 1). It is too small to fully characterize, however favored to be a flash filling hemangioma versus focal vascular shunting. Diffuse low-density of the liver suspicious for steatosis.   Musculoskeletal: No chest wall abnormality. No acute or significant osseous findings.   Review of the MIP images confirms the above findings.   IMPRESSION: Mid ascending aortic aneurysm measuring 4.7 x 4.5 cm. Recommend semi-annual imaging followup by CTA or MRA and referral to cardiothoracic surgery  Recent Lab Findings: Lab Results  Component Value Date   WBC 6.6 10/27/2006   HGB 14.1 10/27/2006   HCT 41.0 10/27/2006   PLT 302 10/27/2006   GLUCOSE 101 (H) 10/27/2006   CHOL 115 (L) 09/28/2015   TRIG 148 09/28/2015   HDL 43 09/28/2015   LDLCALC 42 09/28/2015   ALT 37 09/28/2015   AST 29 09/28/2015   NA 144 10/27/2006   K 4.0 10/27/2006   CL 113 (H) 10/27/2006   CREATININE 1.0 10/27/2006   BUN 14 10/27/2006  CO2 27 10/27/2006   TSH 2.92 10/27/2006      Assessment / Plan:     Asymptomatic aortic aneurysm. Pt currently not at measurements to recommend surgery and we discussed the issues with aneurysms and the natural history and indications for surgery. With his CAD and age, I feel that we should consider surgery after size reaches btw 5-5.5cm. He understands to control his BP and to avoid heavy lifting where he is straining to lift objects. He will continue to be treated for his hypercholesterolemia. I have ordered a follow up non contrast CT of chest in 6 months and if no increase in size to go to yearly  scans. Pt and wife understand these issues and are happy to comply.   I have spent 60 min in review of the records, viewing studies and in face to face with patient and in coordination of future care    Eugenio Hoes 09/01/2022 7:59 PM

## 2022-09-02 ENCOUNTER — Institutional Professional Consult (permissible substitution): Payer: Medicare HMO | Admitting: Thoracic Surgery (Cardiothoracic Vascular Surgery)

## 2022-09-02 VITALS — BP 150/72 | HR 58 | Resp 20 | Ht 72.0 in | Wt 201.0 lb

## 2022-09-02 DIAGNOSIS — I7121 Aneurysm of the ascending aorta, without rupture: Secondary | ICD-10-CM

## 2022-09-02 NOTE — Patient Instructions (Signed)
Non contrast Ct of chest in 6 months

## 2022-11-22 ENCOUNTER — Other Ambulatory Visit: Payer: Self-pay | Admitting: Cardiology

## 2022-11-22 DIAGNOSIS — E782 Mixed hyperlipidemia: Secondary | ICD-10-CM

## 2023-01-07 ENCOUNTER — Other Ambulatory Visit: Payer: Self-pay | Admitting: Cardiology

## 2023-01-13 ENCOUNTER — Other Ambulatory Visit: Payer: Self-pay | Admitting: *Deleted

## 2023-01-13 DIAGNOSIS — I77810 Thoracic aortic ectasia: Secondary | ICD-10-CM

## 2023-01-15 ENCOUNTER — Encounter: Payer: Self-pay | Admitting: Cardiology

## 2023-01-22 DIAGNOSIS — H2513 Age-related nuclear cataract, bilateral: Secondary | ICD-10-CM | POA: Diagnosis not present

## 2023-01-28 ENCOUNTER — Ambulatory Visit
Admission: RE | Admit: 2023-01-28 | Discharge: 2023-01-28 | Disposition: A | Discharge status: Home / Self Care | Payer: Medicare HMO | Source: Ambulatory Visit | Attending: Cardiology | Admitting: Cardiology

## 2023-01-28 DIAGNOSIS — K769 Liver disease, unspecified: Secondary | ICD-10-CM | POA: Diagnosis not present

## 2023-01-28 DIAGNOSIS — I700 Atherosclerosis of aorta: Secondary | ICD-10-CM | POA: Diagnosis not present

## 2023-01-28 DIAGNOSIS — I77810 Thoracic aortic ectasia: Secondary | ICD-10-CM

## 2023-01-28 DIAGNOSIS — I7121 Aneurysm of the ascending aorta, without rupture: Secondary | ICD-10-CM | POA: Diagnosis not present

## 2023-01-28 MED ORDER — IOPAMIDOL (ISOVUE-370) INJECTION 76%
200.0000 mL | Freq: Once | INTRAVENOUS | Status: AC | PRN
  Administered 2023-01-28: 75 mL via INTRAVENOUS

## 2023-02-11 ENCOUNTER — Other Ambulatory Visit: Payer: Self-pay | Admitting: *Deleted

## 2023-02-11 DIAGNOSIS — I77810 Thoracic aortic ectasia: Secondary | ICD-10-CM

## 2023-02-13 ENCOUNTER — Other Ambulatory Visit: Payer: Self-pay | Admitting: *Deleted

## 2023-02-13 ENCOUNTER — Encounter: Payer: Self-pay | Admitting: *Deleted

## 2023-02-13 DIAGNOSIS — I77810 Thoracic aortic ectasia: Secondary | ICD-10-CM

## 2023-02-14 ENCOUNTER — Other Ambulatory Visit (HOSPITAL_COMMUNITY): Payer: Medicare HMO

## 2023-02-14 ENCOUNTER — Ambulatory Visit: Payer: Medicare HMO | Admitting: Vascular Surgery

## 2023-02-18 NOTE — Progress Notes (Unsigned)
Office Note    HPI: Grant Shea is a 77 y.o. (02/11/1946) male presenting at the request of .[name], MD for AAA, right CIA aneurysm, AAA.  On exam today, Grant Shea was doing well, accompanied by his wife for 49 years.  Grant Shea is a native of [place], but has lived in [place] for the majority of his life.  His wife is from [place].  He worked for [employer] for over 30 years.  His father died of an aneurysm and his mother had her aorta replaced roughly 40 years ago.  Since last seen 6 months ago, Grant Shea has no complaints.  He drinks a lot of coffee and states that he has intermittent muscle cramping throughout the body.  His wife is adamant that this is due to his coffee intake and the fact that it is a diuretic.  Denies symptoms of abdominal pain, back pain, chest pain.  Denies symptoms of claudication, ischemic rest pain, tissue loss.  Recently dropped a cinder block on his right great toe, but states the area is healing fine.    The pt is  on a statin for cholesterol management.  The pt is  on a daily aspirin.   Other AC:  - The pt is  on medication for hypertension.   The pt is not diabetic.  Tobacco hx:  former  Past Medical History:  Diagnosis Date   AAA (abdominal aortic aneurysm) (HCC)    a.08/2015 Aortic Duplex: 2.2 x 2.9 cm Infrarenal fusiform AAA.   Coronary atherosclerosis of native coronary artery [I25.10]    a. 2007 NSTEMI/PCI: severe dzs in mLCX (DES x 2), OM1 (DES x1), and RCA (DES x 3 in overlapping fashion), 40% LAD;  b. 08/2015 Myoview: EF 49%, medium defect/moderate severity in basal inferior, mid inferior, and apical inferior locations->inf infarct w/ peri-infarct ischemia--pt doing well @ f/u--med Rx.   DIZZINESS    Mixed hyperlipidemia    Right Common iliac aneurysm (HCC)    a. 08/2015 Duplex: 2.5 x 2.2 cm R CIA aneurysm - stable.    Past Surgical History:  Procedure Laterality Date   CORONARY ANGIOPLASTY WITH STENT PLACEMENT  11/01/2015    LAD 40%, CFX 95%>0% w/ overlapping Taxus 2.5 x 24 mm, 2.5 x 12 mm DES, OM 90%>0% w/ Taxus 2.5 x 16 mm DES, RCA 95%>0% w/ overlapping Taxus 2.5 x 24 mm x 2 and a 2.75 x 32 mm DES, EF 45%        Social History   Socioeconomic History   Marital status: Married    Spouse name: Not on file   Number of children: Not on file   Years of education: Not on file   Highest education level: Not on file  Occupational History   Not on file  Tobacco Use   Smoking status: Former   Smokeless tobacco: Never  Substance and Sexual Activity   Alcohol use: Yes    Comment: ocassionally   Drug use: No   Sexual activity: Not on file  Other Topics Concern   Not on file  Social History Narrative   Not on file   Social Determinants of Health   Financial Resource Strain: Not on file  Food Insecurity: Not on file  Transportation Needs: Not on file  Physical Activity: Not on file  Stress: Not on file  Social Connections: Not on file  Intimate Partner Violence: Not on file   Family History  Problem Relation Age of Onset   Coronary artery  disease Father        Multiple male relatives    Current Outpatient Medications  Medication Sig Dispense Refill   aspirin 81 MG tablet Take 81 mg by mouth daily.     atorvastatin (LIPITOR) 80 MG tablet TAKE 1 TABLET BY MOUTH EVERY DAY 90 tablet 2   levothyroxine (SYNTHROID) 25 MCG tablet Take 25 mcg by mouth every morning.     losartan (COZAAR) 50 MG tablet Take 1 tablet (50 mg total) by mouth daily. 90 tablet 3   metoprolol succinate (TOPROL-XL) 25 MG 24 hr tablet TAKE 1/2 TABLET BY MOUTH DAILY 45 tablet 1   nitroGLYCERIN (NITROSTAT) 0.4 MG SL tablet Place 1 tablet (0.4 mg total) under the tongue every 5 (five) minutes as needed for chest pain. 25 tablet 3   No current facility-administered medications for this visit.    Allergies  Allergen Reactions   Penicillins     Patient states he has not had reaction to pcn but other family members have.     REVIEW  OF SYSTEMS:  [X]  denotes positive finding, [ ]  denotes negative finding Cardiac  Comments:  Chest pain or chest pressure:    Shortness of breath upon exertion:    Short of breath when lying flat:    Irregular heart rhythm:        Vascular    Pain in calf, thigh, or hip brought on by ambulation:    Pain in feet at night that wakes you up from your sleep:     Blood clot in your veins:    Leg swelling:         Pulmonary    Oxygen at home:    Productive cough:     Wheezing:         Neurologic    Sudden weakness in arms or legs:     Sudden numbness in arms or legs:     Sudden onset of difficulty speaking or slurred speech:    Temporary loss of vision in one eye:     Problems with dizziness:         Gastrointestinal    Blood in stool:     Vomited blood:         Genitourinary    Burning when urinating:     Blood in urine:        Psychiatric    Major depression:         Hematologic    Bleeding problems:    Problems with blood clotting too easily:        Skin    Rashes or ulcers:        Constitutional    Fever or chills:      PHYSICAL EXAMINATION:  There were no vitals filed for this visit.  General:  WDWN in NAD; vital signs documented above Gait: Not observed HENT: WNL, normocephalic Pulmonary: normal non-labored breathing , without wheezing Cardiac: regular HR Abdomen: soft, NT, no masses Skin: without rashes Vascular Exam/Pulses:  Right Left  Radial 2+ (normal) 2+ (normal)  Ulnar    Femoral 2+ (normal) 2+ (normal)  Popliteal    DP 2+ (normal) nonpalpable  PT     Extremities: without ischemic changes, without Gangrene , without cellulitis; without open wounds;  Musculoskeletal: no muscle wasting or atrophy  Neurologic: A&O X 3;  No focal weakness or paresthesias are detected Psychiatric:  The pt has Normal affect.   Non-Invasive Vascular Imaging:    Abdominal Aorta: There is  evidence of abnormal dilatation of the distal  Abdominal aorta. There is  evidence of abnormal dilation of the Right  Common Iliac artery. The largest aortic measurement is 3.4 cm. The largest  aortic diameter remains essentially  unchanged compared to prior exam. Previous diameter measurement was 3.4 cm  obtained on 06/25/22.     ASSESSMENT/PLAN: HOGAN FUNARO is a 77 y.o. male presenting with known AAA, right CIA aneurysm.  CTA 6 months ago demonstrated AAA 3.4 cm, right CIA 2.7 cm.    Ultrasound today demonstrated no size change in the AAA or right common iliac artery - 3.4 cm, 2.7 cm respectively  My plan is to follow this on a yearly basis.  Ultrasound today demonstrated no change in size.  Neither of these meet size criteria for repair.   My plan is to see him in 12 months with repeat duplex ultrasound.  No indication for repair at this time.   Will also order ABIs as the left dorsalis pedis was nonpalpable.  Discussed the signs and symptoms of rupture.  I asked him to call 911 immediately should any of these occur.   [name], MD [facility] [phone] Total time of patient care including pre-visit research, consultation, and documentation greater than 30 minutes

## 2023-02-20 ENCOUNTER — Ambulatory Visit (HOSPITAL_COMMUNITY)
Admission: RE | Admit: 2023-02-20 | Discharge: 2023-02-20 | Disposition: A | Discharge status: Home / Self Care | Payer: Medicare HMO | Source: Ambulatory Visit | Attending: Vascular Surgery | Admitting: Vascular Surgery

## 2023-02-20 ENCOUNTER — Ambulatory Visit: Payer: Medicare HMO | Admitting: Vascular Surgery

## 2023-02-20 ENCOUNTER — Encounter: Payer: Self-pay | Admitting: Vascular Surgery

## 2023-02-20 VITALS — BP 145/86 | HR 48 | Temp 98.0°F | Resp 20 | Ht 72.0 in | Wt 199.0 lb

## 2023-02-20 DIAGNOSIS — I7143 Infrarenal abdominal aortic aneurysm, without rupture: Secondary | ICD-10-CM | POA: Diagnosis not present

## 2023-02-20 DIAGNOSIS — I723 Aneurysm of iliac artery: Secondary | ICD-10-CM

## 2023-02-26 NOTE — Progress Notes (Unsigned)
[address]       [place] [address]             [phone]           Grant Shea [facility] Medical Record #[id number] Date of Birth: 04/25/1945  Grant Sparrow, MD [name], MD  Chief Complaint:  follow up aneurysm   History of Present Illness:     PT here to discuss findings of ascending aortic aneurysm. His scan has it stable at about 4.6cm. No change in symptoms      Past Medical History:  Diagnosis Date   AAA (abdominal aortic aneurysm) (HCC)    a.08/2015 Aortic Duplex: 2.2 x 2.9 cm Infrarenal fusiform AAA.   Coronary atherosclerosis of native coronary artery [I25.10]    a. 2007 NSTEMI/PCI: severe dzs in mLCX (DES x 2), OM1 (DES x1), and RCA (DES x 3 in overlapping fashion), 40% LAD;  b. 08/2015 Myoview: EF 49%, medium defect/moderate severity in basal inferior, mid inferior, and apical inferior locations->inf infarct w/ peri-infarct ischemia--pt doing well @ f/u--med Rx.   DIZZINESS    Mixed hyperlipidemia    Right Common iliac aneurysm (HCC)    a. 08/2015 Duplex: 2.5 x 2.2 cm R CIA aneurysm - stable.    Past Surgical History:  Procedure Laterality Date   CORONARY ANGIOPLASTY WITH STENT PLACEMENT  11/01/2015   LAD 40%, CFX 95%>0% w/ overlapping Taxus 2.5 x 24 mm, 2.5 x 12 mm DES, OM 90%>0% w/ Taxus 2.5 x 16 mm DES, RCA 95%>0% w/ overlapping Taxus 2.5 x 24 mm x 2 and a 2.75 x 32 mm DES, EF 45%        Social History   Tobacco Use  Smoking Status Former  Smokeless Tobacco Never    Social History   Substance and Sexual Activity  Alcohol Use Yes   Comment: ocassionally    Social History   Socioeconomic History   Marital status: Married    Spouse name: Not on file   Number of children: Not on file   Years of education: Not on file   Highest education level: Not on file  Occupational History   Not on file  Tobacco Use   Smoking status: Former   Smokeless tobacco: Never  Substance and Sexual Activity   Alcohol use:  Yes    Comment: ocassionally   Drug use: No   Sexual activity: Not on file  Other Topics Concern   Not on file  Social History Narrative   Not on file   Social Determinants of Health   Financial Resource Strain: Not on file  Food Insecurity: Not on file  Transportation Needs: Not on file  Physical Activity: Not on file  Stress: Not on file  Social Connections: Not on file  Intimate Partner Violence: Not on file    Allergies  Allergen Reactions   Penicillins     Patient states he has not had reaction to pcn but other family members have.    Current Outpatient Medications  Medication Sig Dispense Refill   aspirin 81 MG tablet Take 81 mg by mouth daily.     atorvastatin (LIPITOR) 80 MG tablet TAKE 1 TABLET BY MOUTH EVERY DAY 90 tablet 2   levothyroxine (SYNTHROID) 25 MCG tablet Take 25 mcg by mouth every morning.     losartan (COZAAR) 50 MG tablet Take 1 tablet (50 mg total) by mouth daily. 90 tablet 3   metoprolol succinate (TOPROL-XL)  25 MG 24 hr tablet TAKE 1/2 TABLET BY MOUTH DAILY 45 tablet 1   nitroGLYCERIN (NITROSTAT) 0.4 MG SL tablet Place 1 tablet (0.4 mg total) under the tongue every 5 (five) minutes as needed for chest pain. 25 tablet 3   No current facility-administered medications for this visit.     Family History  Problem Relation Age of Onset   Coronary artery disease Father        Multiple male relatives       Physical Exam:      Diagnostic Studies & Laboratory data: I have personally reviewed the following studies and agree with the findings     Recent Radiology Findings:   CTA Chest (01/2023) FINDINGS: Cardiovascular: Some mild motion artifact is noted. There is again noted dilatation of the ascending aorta to 4.7 cm. Sinus of Valsalva measures approximately 4.3 cm. Sino-tubular junction measures 3.1 cm. Calcification of the aortic valve leaflets is seen. Coronary calcifications and stent placement is noted. Pulmonary artery  as visualized is within normal limits.   Mediastinum/Nodes: Thoracic inlet is within normal limits. No hilar or mediastinal adenopathy is noted. The esophagus as visualized is within normal limits.   Lungs/Pleura: Mild emphysematous changes are noted. No focal infiltrate or sizable effusion is seen. No parenchymal nodules are noted.   Upper Abdomen: Visualized upper abdomen again shows a hyperdense focus in the dome of the liver best seen on image number 335 of series 9. This likely represents a small hemangioma. The remainder of the upper abdomen is unremarkable.   Musculoskeletal: No acute rib abnormality is noted. Degenerative changes of the thoracic spine are seen. No compression deformities are noted.   Review of the MIP images confirms the above findings.   IMPRESSION: Dilatation of the ascending aorta to 4.7 cm. This is roughly similar to that seen on the prior exam. Ascending thoracic aortic aneurysm. Recommend semi-annual imaging followup by CTA or MRA and referral to cardiothoracic surgery if not already obtained. This recommendation follows 2010 ACCF/AHA/AATS/ACR/ASA/SCA/SCAI/SIR/STS/SVM Guidelines for the Diagnosis and Management of Patients With Thoracic Aortic Disease. Circulation. 2010; 121: M578-I696. Aortic aneurysm NOS (ICD10-I71.9)   Hyperdense focus within the right lobe of the liver likely representing small hemangioma.      Recent Lab Findings: Lab Results  Component Value Date   WBC 6.6 10/27/2006   HGB 14.1 10/27/2006   HCT 41.0 10/27/2006   PLT 302 10/27/2006   GLUCOSE 101 (H) 10/27/2006   CHOL 115 (L) 09/28/2015   TRIG 148 09/28/2015   HDL 43 09/28/2015   LDLCALC 42 09/28/2015   ALT 37 09/28/2015   AST 29 09/28/2015   NA 144 10/27/2006   K 4.0 10/27/2006   CL 113 (H) 10/27/2006   CREATININE 1.0 10/27/2006   BUN 14 10/27/2006   CO2 27 10/27/2006   TSH 2.92 10/27/2006      Assessment / Plan:     Stable ascending aortic aneurysm at  4.6cm. Will now go to yearly ct scan and again it was stressed to control HTN and to not lift heavy objects. Pt and wife happy with information   I have spent 30 min in review of the records, viewing studies and in face to face with patient and in coordination of future care    Eugenio Hoes 02/26/2023 5:07 PM

## 2023-02-27 ENCOUNTER — Encounter: Payer: Self-pay | Admitting: Thoracic Surgery (Cardiothoracic Vascular Surgery)

## 2023-02-27 ENCOUNTER — Ambulatory Visit: Payer: Medicare HMO | Admitting: Thoracic Surgery (Cardiothoracic Vascular Surgery)

## 2023-02-27 VITALS — BP 124/72 | HR 70 | Resp 20 | Ht 72.0 in | Wt 199.0 lb

## 2023-02-27 DIAGNOSIS — I712 Thoracic aortic aneurysm, without rupture, unspecified: Secondary | ICD-10-CM | POA: Insufficient documentation

## 2023-02-27 DIAGNOSIS — I7121 Aneurysm of the ascending aorta, without rupture: Secondary | ICD-10-CM

## 2023-02-27 NOTE — Patient Instructions (Signed)
Non contrast CT in a year

## 2023-03-10 ENCOUNTER — Other Ambulatory Visit: Payer: Self-pay

## 2023-03-10 DIAGNOSIS — I723 Aneurysm of iliac artery: Secondary | ICD-10-CM

## 2023-07-11 ENCOUNTER — Encounter: Payer: Self-pay | Admitting: Cardiology

## 2023-07-14 DIAGNOSIS — R059 Cough, unspecified: Secondary | ICD-10-CM | POA: Diagnosis not present

## 2023-07-15 ENCOUNTER — Other Ambulatory Visit

## 2023-07-17 ENCOUNTER — Other Ambulatory Visit: Payer: Self-pay | Admitting: Cardiology

## 2023-08-04 ENCOUNTER — Ambulatory Visit
Admission: RE | Admit: 2023-08-04 | Discharge: 2023-08-04 | Disposition: A | Discharge status: Home / Self Care | Source: Ambulatory Visit | Attending: Cardiology | Admitting: Cardiology

## 2023-08-04 DIAGNOSIS — I7121 Aneurysm of the ascending aorta, without rupture: Secondary | ICD-10-CM | POA: Diagnosis not present

## 2023-08-04 DIAGNOSIS — I77810 Thoracic aortic ectasia: Secondary | ICD-10-CM

## 2023-08-04 MED ORDER — IOPAMIDOL (ISOVUE-370) INJECTION 76%
500.0000 mL | Freq: Once | INTRAVENOUS | Status: AC | PRN
  Administered 2023-08-04: 75 mL via INTRAVENOUS

## 2023-08-08 ENCOUNTER — Encounter: Payer: Self-pay | Admitting: *Deleted

## 2023-08-08 ENCOUNTER — Other Ambulatory Visit: Payer: Self-pay | Admitting: *Deleted

## 2023-08-08 DIAGNOSIS — I7143 Infrarenal abdominal aortic aneurysm, without rupture: Secondary | ICD-10-CM

## 2023-08-12 ENCOUNTER — Other Ambulatory Visit: Payer: Self-pay | Admitting: Cardiology

## 2023-08-12 DIAGNOSIS — E782 Mixed hyperlipidemia: Secondary | ICD-10-CM

## 2023-08-16 ENCOUNTER — Other Ambulatory Visit: Payer: Self-pay | Admitting: Cardiology

## 2023-08-20 ENCOUNTER — Other Ambulatory Visit: Payer: Self-pay | Admitting: Cardiology

## 2023-08-26 DIAGNOSIS — E7800 Pure hypercholesterolemia, unspecified: Secondary | ICD-10-CM | POA: Diagnosis not present

## 2023-08-26 DIAGNOSIS — Z125 Encounter for screening for malignant neoplasm of prostate: Secondary | ICD-10-CM | POA: Diagnosis not present

## 2023-08-26 DIAGNOSIS — E039 Hypothyroidism, unspecified: Secondary | ICD-10-CM | POA: Diagnosis not present

## 2023-08-26 DIAGNOSIS — Z79899 Other long term (current) drug therapy: Secondary | ICD-10-CM | POA: Diagnosis not present

## 2023-08-26 LAB — LAB REPORT - SCANNED: EGFR: 73

## 2023-09-08 ENCOUNTER — Other Ambulatory Visit: Payer: Self-pay | Admitting: Cardiology

## 2023-09-08 DIAGNOSIS — E782 Mixed hyperlipidemia: Secondary | ICD-10-CM

## 2023-09-14 ENCOUNTER — Other Ambulatory Visit: Payer: Self-pay | Admitting: Cardiology

## 2023-09-17 ENCOUNTER — Other Ambulatory Visit: Payer: Self-pay | Admitting: Cardiology

## 2023-09-17 DIAGNOSIS — E782 Mixed hyperlipidemia: Secondary | ICD-10-CM

## 2023-10-03 ENCOUNTER — Other Ambulatory Visit: Payer: Self-pay | Admitting: Cardiology

## 2023-10-03 DIAGNOSIS — E782 Mixed hyperlipidemia: Secondary | ICD-10-CM

## 2023-10-13 ENCOUNTER — Other Ambulatory Visit: Payer: Self-pay | Admitting: Cardiology

## 2023-10-13 DIAGNOSIS — E782 Mixed hyperlipidemia: Secondary | ICD-10-CM

## 2023-10-15 DIAGNOSIS — L821 Other seborrheic keratosis: Secondary | ICD-10-CM | POA: Diagnosis not present

## 2023-10-15 DIAGNOSIS — C44319 Basal cell carcinoma of skin of other parts of face: Secondary | ICD-10-CM | POA: Diagnosis not present

## 2023-10-15 DIAGNOSIS — D485 Neoplasm of uncertain behavior of skin: Secondary | ICD-10-CM | POA: Diagnosis not present

## 2023-10-17 ENCOUNTER — Other Ambulatory Visit: Payer: Self-pay | Admitting: Cardiology

## 2023-10-17 DIAGNOSIS — E782 Mixed hyperlipidemia: Secondary | ICD-10-CM

## 2023-10-31 ENCOUNTER — Other Ambulatory Visit: Payer: Self-pay | Admitting: Cardiology

## 2023-11-04 ENCOUNTER — Other Ambulatory Visit (HOSPITAL_COMMUNITY): Payer: Self-pay

## 2023-11-04 ENCOUNTER — Telehealth: Payer: Self-pay | Admitting: Cardiology

## 2023-11-04 ENCOUNTER — Other Ambulatory Visit: Payer: Self-pay

## 2023-11-04 MED ORDER — METOPROLOL SUCCINATE ER 25 MG PO TB24
12.5000 mg | ORAL_TABLET | Freq: Every day | ORAL | 0 refills | Status: DC
  Filled 2023-11-04: qty 45, 90d supply, fill #0

## 2023-11-04 MED ORDER — METOPROLOL SUCCINATE ER 25 MG PO TB24: 12.5000 mg | ORAL_TABLET | Freq: Every day | ORAL | 0 refills | Status: DC

## 2023-11-04 NOTE — Telephone Encounter (Signed)
 Pt's medication was sent to pt's preferred pharmacy as requested. Confirmation received.

## 2023-11-04 NOTE — Telephone Encounter (Signed)
 Pt's medication was resent to pt's preferred pharmacy as requested. Confirmation received.

## 2023-11-04 NOTE — Telephone Encounter (Signed)
*  STAT* If patient is at the pharmacy, call can be transferred to refill team.   1. Which medications need to be refilled? (please list name of each medication and dose if known)   metoprolol  succinate (TOPROL -XL) 25 MG 24 hr tablet    2. Which pharmacy/location (including street and city if local pharmacy) is medication to be sent to?  [id number] - [place], [place] - [address] AT [address] OF [address]      3. Do they need a 30 day or 90 day supply? 90 day    Pt has upcoming appt in October for 1 yr f/u scheduled

## 2023-11-14 ENCOUNTER — Telehealth: Payer: Self-pay | Admitting: Cardiology

## 2023-11-14 DIAGNOSIS — E782 Mixed hyperlipidemia: Secondary | ICD-10-CM

## 2023-11-14 MED ORDER — ATORVASTATIN CALCIUM 80 MG PO TABS: 80.0000 mg | ORAL_TABLET | Freq: Every day | ORAL | 0 refills | Status: DC

## 2023-11-14 NOTE — Telephone Encounter (Signed)
 RX sent to requested Pharmacy

## 2023-11-14 NOTE — Telephone Encounter (Signed)
*  STAT* If patient is at the pharmacy, call can be transferred to refill team.   1. Which medications need to be refilled? (please list name of each medication and dose if known)  atorvastatin  (LIPITOR) 80 MG tablet  2. Which pharmacy/location (including street and city if local pharmacy) is medication to be sent to? [id number] - [place], [place] - [address] AT [address] [facility] [address]    3. Do they need a 30 day or 90 day supply?  90 day supply

## 2023-11-17 DIAGNOSIS — C44319 Basal cell carcinoma of skin of other parts of face: Secondary | ICD-10-CM | POA: Diagnosis not present

## 2024-01-19 NOTE — Progress Notes (Signed)
 HPI: FU coronary artery disease. Cardiac catheterization in 2007 following a myocardial infarction showed significant disease in the mid circumflex and first marginal branch. There was also significant disease in the right coronary artery. He had a 40% LAD. He had 2 stents to the circumflex, 1 stent to the obtuse marginal and 3 overlapping stents to the right coronary artery. His ejection fraction was 45% with inferior basal hypokinesis. Nuclear study 5/17 showed EF 49; inferior infarct with peri-infarct ischemia towards the apex. Carotid Dopplers August 2019 showed 1 to 39% bilateral stenosis. Echocardiogram April 2024 showed ejection fraction 50 to 55%, moderate right ventricular enlargement, mild left atrial enlargement, mild aortic stenosis with mean gradient 12.3 mmHg, moderately dilated ascending aorta at 48 mm.  Abdominal ultrasound October 2024 showed 3.4 cm right common iliac artery.  CTA April 2025 showed 4.6 cm ascending thoracic aortic aneurysm.  Since last seen, patient denies dyspnea, chest pain, palpitations or syncope.  Current Outpatient Medications  Medication Sig Dispense Refill   aspirin 81 MG tablet Take 81 mg by mouth daily.     atorvastatin  (LIPITOR) 80 MG tablet Take 1 tablet (80 mg total) by mouth daily. 90 tablet 0   levothyroxine (SYNTHROID) 25 MCG tablet Take 25 mcg by mouth every morning.     losartan  (COZAAR ) 50 MG tablet Take 1 tablet (50 mg total) by mouth daily. 90 tablet 3   metoprolol  succinate (TOPROL -XL) 25 MG 24 hr tablet TAKE 1/2 TABLET BY MOUTH DAILY. 45 tablet 0   nitroGLYCERIN  (NITROSTAT ) 0.4 MG SL tablet Place 1 tablet (0.4 mg total) under the tongue every 5 (five) minutes as needed for chest pain. 25 tablet 3   No current facility-administered medications for this visit.     Past Medical History:  Diagnosis Date   AAA (abdominal aortic aneurysm)    a.08/2015 Aortic Duplex: 2.2 x 2.9 cm Infrarenal fusiform AAA.   Coronary atherosclerosis of native  coronary artery [I25.10]    a. 2007 NSTEMI/PCI: severe dzs in mLCX (DES x 2), OM1 (DES x1), and RCA (DES x 3 in overlapping fashion), 40% LAD;  b. 08/2015 Myoview : EF 49%, medium defect/moderate severity in basal inferior, mid inferior, and apical inferior locations->inf infarct w/ peri-infarct ischemia--pt doing well @ f/u--med Rx.   DIZZINESS    Mixed hyperlipidemia    Right Common iliac aneurysm (HCC)    a. 08/2015 Duplex: 2.5 x 2.2 cm R CIA aneurysm - stable.    Past Surgical History:  Procedure Laterality Date   CORONARY ANGIOPLASTY WITH STENT PLACEMENT  11/01/2015   LAD 40%, CFX 95%>0% w/ overlapping Taxus 2.5 x 24 mm, 2.5 x 12 mm DES, OM 90%>0% w/ Taxus 2.5 x 16 mm DES, RCA 95%>0% w/ overlapping Taxus 2.5 x 24 mm x 2 and a 2.75 x 32 mm DES, EF 45%        Social History   Socioeconomic History   Marital status: Married    Spouse name: Not on file   Number of children: Not on file   Years of education: Not on file   Highest education level: Not on file  Occupational History   Not on file  Tobacco Use   Smoking status: Former   Smokeless tobacco: Never  Substance and Sexual Activity   Alcohol use: Yes    Comment: ocassionally   Drug use: No   Sexual activity: Not on file  Other Topics Concern   Not on file  Social History Narrative  Not on file   Social Drivers of Health   Financial Resource Strain: Not on file  Food Insecurity: Not on file  Transportation Needs: Not on file  Physical Activity: Not on file  Stress: Not on file  Social Connections: Not on file  Intimate Partner Violence: Not on file    Family History  Problem Relation Age of Onset   Coronary artery disease Father        Multiple male relatives    ROS: no fevers or chills, productive cough, hemoptysis, dysphasia, odynophagia, melena, hematochezia, dysuria, hematuria, rash, seizure activity, orthopnea, PND, pedal edema, claudication. Remaining systems are negative.  Physical  Exam: Well-developed well-nourished in no acute distress.  Skin is warm and dry.  HEENT is normal.  Neck is supple.  Chest is clear to auscultation with normal expansion.  Cardiovascular exam is regular rate and rhythm.  2/6 systolic murmur left sternal border. Abdominal exam nontender or distended. No masses palpated. Extremities show no edema. neuro grossly intact  EKG Interpretation Date/Time:  Monday February 02 2024 10:29:31 EDT Ventricular Rate:  47 PR Interval:    QRS Duration:  90 QT Interval:  436 QTC Calculation: 385 R Axis:   20  Text Interpretation: Marked sinus bradycardia Confirmed by Pietro Rogue ([id number]) on 02/02/2024 10:33:25 AM    A/P  1 coronary artery disease-patient denies chest pain.  Continue aspirin and statin.  2 thoracic aortic aneurysm-plan follow-up CTA October 2025.  3 aortic stenosis-mild on most recent echocardiogram.  Will repeat.  4 hyperlipidemia-continue statin.  Check lipids and liver.  5 hypertension-patient's blood pressure is elevated; increase losartan  to 100 mg daily.  Check potassium and renal function in 1 week.  Follow blood pressure and advance medications as needed.  6 history of ischemic cardiomyopathy-continue ARB and beta-blocker.  LV function low normal on most recent study.  7 iliac aneurysm-now followed by vascular surgery.  Rogue Pietro, MD

## 2024-01-26 ENCOUNTER — Other Ambulatory Visit: Payer: Self-pay | Admitting: Cardiology

## 2024-01-30 ENCOUNTER — Telehealth: Payer: Self-pay | Admitting: Cardiology

## 2024-01-30 NOTE — Telephone Encounter (Signed)
 Consuelo from [facility] calling to request a new prior auth for the pts CT scan on 02/03/24

## 2024-02-02 ENCOUNTER — Ambulatory Visit: Attending: Cardiology | Admitting: Cardiology

## 2024-02-02 ENCOUNTER — Encounter: Payer: Self-pay | Admitting: Cardiology

## 2024-02-02 VITALS — BP 158/68 | HR 48 | Ht 72.0 in | Wt 190.6 lb

## 2024-02-02 DIAGNOSIS — I2510 Atherosclerotic heart disease of native coronary artery without angina pectoris: Secondary | ICD-10-CM

## 2024-02-02 DIAGNOSIS — I350 Nonrheumatic aortic (valve) stenosis: Secondary | ICD-10-CM

## 2024-02-02 DIAGNOSIS — I723 Aneurysm of iliac artery: Secondary | ICD-10-CM

## 2024-02-02 DIAGNOSIS — I712 Thoracic aortic aneurysm, without rupture, unspecified: Secondary | ICD-10-CM | POA: Diagnosis not present

## 2024-02-02 DIAGNOSIS — E782 Mixed hyperlipidemia: Secondary | ICD-10-CM | POA: Diagnosis not present

## 2024-02-02 DIAGNOSIS — I10 Essential (primary) hypertension: Secondary | ICD-10-CM | POA: Diagnosis not present

## 2024-02-02 MED ORDER — LOSARTAN POTASSIUM 100 MG PO TABS: 100.0000 mg | ORAL_TABLET | Freq: Every day | ORAL | 3 refills | Status: AC

## 2024-02-02 NOTE — Patient Instructions (Signed)
 Medication Instructions:  Your physician has recommended you make the following change in your medication:   INCREASE: Losartan  to 100 mg by mouth once daily  *If you need a refill on your cardiac medications before your next appointment, please call your pharmacy*  Lab Work: 1 WEEK after starting increased dose of Losartan : Fasting lipid panel, CMP  If you have labs (blood work) drawn today and your tests are completely normal, you will receive your results only by: MyChart Message (if you have MyChart) OR A paper copy in the mail If you have any lab test that is abnormal or we need to change your treatment, we will call you to review the results.  Testing/Procedures: Your physician has requested that you have an echocardiogram. Echocardiography is a painless test that uses sound waves to create images of your heart. It provides your doctor with information about the size and shape of your heart and how well your heart's chambers and valves are working. This procedure takes approximately one hour. There are no restrictions for this procedure. Please do NOT wear cologne, perfume, aftershave, or lotions (deodorant is allowed). Please arrive 15 minutes prior to your appointment time.  Please note: We ask at that you not bring children with you during ultrasound (echo/ vascular) testing. Due to room size and safety concerns, children are not allowed in the ultrasound rooms during exams. Our front office staff cannot provide observation of children in our lobby area while testing is being conducted. An adult accompanying a patient to their appointment will only be allowed in the ultrasound room at the discretion of the ultrasound technician under special circumstances. We apologize for any inconvenience.   Follow-Up: At [facility], you and your health needs are our priority.  As part of our continuing mission to provide you with exceptional heart care, our providers are all part of one  team.  This team includes your primary Cardiologist (physician) and Advanced Practice Providers or APPs (Physician Assistants and Nurse Practitioners) who all work together to provide you with the care you need, when you need it.  Your next appointment:   1 year(s)  Provider:   Dr. Pietro

## 2024-02-03 ENCOUNTER — Ambulatory Visit: Payer: Self-pay | Admitting: Cardiology

## 2024-02-03 ENCOUNTER — Inpatient Hospital Stay
Admission: RE | Admit: 2024-02-03 | Discharge: 2024-02-03 | Disposition: A | Source: Ambulatory Visit | Attending: Cardiology | Admitting: Cardiology

## 2024-02-03 DIAGNOSIS — I711 Thoracic aortic aneurysm, ruptured, unspecified: Secondary | ICD-10-CM | POA: Diagnosis not present

## 2024-02-03 DIAGNOSIS — I712 Thoracic aortic aneurysm, without rupture, unspecified: Secondary | ICD-10-CM

## 2024-02-03 DIAGNOSIS — I7143 Infrarenal abdominal aortic aneurysm, without rupture: Secondary | ICD-10-CM

## 2024-02-03 MED ORDER — IOPAMIDOL (ISOVUE-370) INJECTION 76%
80.0000 mL | Freq: Once | INTRAVENOUS | Status: AC | PRN
  Administered 2024-02-03: 80 mL via INTRAVENOUS

## 2024-02-04 DIAGNOSIS — H2513 Age-related nuclear cataract, bilateral: Secondary | ICD-10-CM | POA: Diagnosis not present

## 2024-02-04 DIAGNOSIS — H1045 Other chronic allergic conjunctivitis: Secondary | ICD-10-CM | POA: Diagnosis not present

## 2024-02-04 DIAGNOSIS — H353131 Nonexudative age-related macular degeneration, bilateral, early dry stage: Secondary | ICD-10-CM | POA: Diagnosis not present

## 2024-02-05 DIAGNOSIS — J439 Emphysema, unspecified: Secondary | ICD-10-CM | POA: Diagnosis not present

## 2024-02-05 DIAGNOSIS — J029 Acute pharyngitis, unspecified: Secondary | ICD-10-CM | POA: Diagnosis not present

## 2024-02-05 DIAGNOSIS — R059 Cough, unspecified: Secondary | ICD-10-CM | POA: Diagnosis not present

## 2024-02-05 DIAGNOSIS — Z87891 Personal history of nicotine dependence: Secondary | ICD-10-CM | POA: Diagnosis not present

## 2024-02-05 DIAGNOSIS — Z03818 Encounter for observation for suspected exposure to other biological agents ruled out: Secondary | ICD-10-CM | POA: Diagnosis not present

## 2024-02-05 NOTE — Telephone Encounter (Signed)
 Letter of results sent to pt

## 2024-02-09 ENCOUNTER — Other Ambulatory Visit: Payer: Self-pay | Admitting: Cardiology

## 2024-02-09 DIAGNOSIS — E782 Mixed hyperlipidemia: Secondary | ICD-10-CM

## 2024-02-10 DIAGNOSIS — I723 Aneurysm of iliac artery: Secondary | ICD-10-CM | POA: Diagnosis not present

## 2024-02-10 DIAGNOSIS — I2510 Atherosclerotic heart disease of native coronary artery without angina pectoris: Secondary | ICD-10-CM | POA: Diagnosis not present

## 2024-02-10 DIAGNOSIS — E782 Mixed hyperlipidemia: Secondary | ICD-10-CM | POA: Diagnosis not present

## 2024-02-10 DIAGNOSIS — I350 Nonrheumatic aortic (valve) stenosis: Secondary | ICD-10-CM | POA: Diagnosis not present

## 2024-02-10 DIAGNOSIS — I712 Thoracic aortic aneurysm, without rupture, unspecified: Secondary | ICD-10-CM | POA: Diagnosis not present

## 2024-02-11 ENCOUNTER — Ambulatory Visit: Payer: Self-pay | Admitting: Cardiology

## 2024-02-11 DIAGNOSIS — I350 Nonrheumatic aortic (valve) stenosis: Secondary | ICD-10-CM

## 2024-02-11 LAB — COMPREHENSIVE METABOLIC PANEL WITH GFR
ALT: 34 IU/L (ref 0–44)
AST: 26 IU/L (ref 0–40)
Albumin: 4 g/dL (ref 3.8–4.8)
Alkaline Phosphatase: 119 IU/L (ref 47–123)
BUN/Creatinine Ratio: 24 (ref 10–24)
BUN: 23 mg/dL (ref 8–27)
Bilirubin Total: 0.4 mg/dL (ref 0.0–1.2)
CO2: 20 mmol/L (ref 20–29)
Calcium: 9.1 mg/dL (ref 8.6–10.2)
Chloride: 104 mmol/L (ref 96–106)
Creatinine, Ser: 0.96 mg/dL (ref 0.76–1.27)
Globulin, Total: 1.8 g/dL (ref 1.5–4.5)
Glucose: 86 mg/dL (ref 70–99)
Potassium: 3.7 mmol/L (ref 3.5–5.2)
Sodium: 140 mmol/L (ref 134–144)
Total Protein: 5.8 g/dL — ABNORMAL LOW (ref 6.0–8.5)
eGFR: 82 mL/min/1.73 (ref >59)

## 2024-02-11 LAB — LIPID PANEL
Chol/HDL Ratio: 2.5 ratio (ref 0.0–5.0)
Cholesterol, Total: 111 mg/dL (ref 100–199)
HDL: 44 mg/dL (ref >39)
LDL Chol Calc (NIH): 44 mg/dL (ref 0–99)
Triglycerides: 131 mg/dL (ref 0–149)
VLDL Cholesterol Cal: 23 mg/dL (ref 5–40)

## 2024-02-13 NOTE — Telephone Encounter (Signed)
 Letter of results sent to pt

## 2024-02-24 NOTE — Progress Notes (Unsigned)
[address] [place] [address]             [phone]            Grant Shea [id number] Oct 29, 1945   History of Present Illness:  Grant Shea is a 78 year old man with medical history of  hypertension, coronary atherosclerosis, iliac aneurysm, abdominal aortic aneurysm, hyperlipidemia, emphysema, and former smoker who presents for continued follow up of ascending thoracic aortic aneurysm.  Aneurysm has stayed stable in size and on recent CTA of chest measured 4.5 cm. His father died of an aneurysm and his mother had her aorta replaced roughly 40 years ago.   He presents to the clinic and reports that he has been doing well.  His blood pressure is well controlled with current medication therapy.  He is active and works on cars. He denies heavy lifting and uses proper machines to avoid lifting.  He denies chest pain, shortness of breath and lower leg swelling.   Current Outpatient Medications on File Prior to Visit  Medication Sig Dispense Refill   aspirin 81 MG tablet Take 81 mg by mouth daily.     atorvastatin  (LIPITOR) 80 MG tablet TAKE 1 TABLET BY MOUTH EVERY DAY 90 tablet 3   levothyroxine (SYNTHROID) 25 MCG tablet Take 25 mcg by mouth every morning.     losartan  (COZAAR ) 100 MG tablet Take 1 tablet (100 mg total) by mouth daily. 90 tablet 3   metoprolol  succinate (TOPROL -XL) 25 MG 24 hr tablet TAKE 1/2 TABLET BY MOUTH DAILY. 45 tablet 0   nitroGLYCERIN  (NITROSTAT ) 0.4 MG SL tablet Place 1 tablet (0.4 mg total) under the tongue every 5 (five) minutes as needed for chest pain. (Patient not taking: Reported on 02/25/2024) 25 tablet 3   No current facility-administered medications on file prior to visit.     ROS: Review of Systems  Constitutional: Negative.  Negative for malaise/fatigue.  Respiratory: Negative.  Negative for cough and shortness of breath.   Cardiovascular:  Negative for chest pain and leg swelling.     BP (!) 145/70  (BP Location: Right Arm)   Pulse (!) 51   Resp 18   Ht 6' (1.829 m)   Wt 194 lb (88 kg)   SpO2 93%   BMI 26.31 kg/m   Physical Exam Constitutional:      Appearance: Normal appearance.  HENT:     Head: Normocephalic and atraumatic.  Cardiovascular:     Rate and Rhythm: Normal rate and regular rhythm.     Heart sounds: Normal heart sounds, S1 normal and S2 normal.  Pulmonary:     Effort: Pulmonary effort is normal.     Breath sounds: Normal breath sounds.  Skin:    General: Skin is warm and dry.  Neurological:     General: No focal deficit present.     Mental Status: He is alert and oriented to person, place, and time.      Imaging: CLINICAL DATA:  78 year old male with history of thoracic aortic aneurysm. Follow-up study.   EXAM: CT ANGIOGRAPHY CHEST WITH CONTRAST   TECHNIQUE: Multidetector CT imaging of the chest was performed using the standard protocol during bolus administration of intravenous contrast. Multiplanar CT image reconstructions and MIPs were obtained to evaluate the vascular anatomy.   RADIATION DOSE REDUCTION: This exam was performed according to the departmental dose-optimization program which includes automated exposure control, adjustment of the  mA and/or kV according to patient size and/or use of iterative reconstruction technique.   CONTRAST:  Eighty mL Isovue  370, intravenous   COMPARISON:  08/04/2023   FINDINGS: Cardiovascular: Preferential opacification of the thoracic aorta. No evidence of thoracic aortic dissection. Mild scattered atherosclerotic calcifications. Similar appearing aortic valvular calcifications. Normal heart size. No pericardial effusion.   Sinues of Valsalva: 34 mm 37 x 36 mm ,unchanged   Sinotubular Junction: 33 mm ,unchanged   Ascending Aorta: 45 mm ,unchanged   Aortic Arch: 31 mm ,unchanged   Descending aorta: 30 mm at the level of the carina ,unchanged   Branch vessels: Conventional branching pattern. No  significant atherosclerotic changes.   Coronary arteries: Separate origins of the left anterior descending left circumflex coronary arteries from the left coronary cusp. Three-vessel atherosclerotic calcifications. Right coronary stent is again seen.   Main pulmonary artery: 21 mm ,unchanged. No evidence of central pulmonary embolism.   Pulmonary veins: No anomalous pulmonary venous return. No evidence of left atrial appendage thrombus.   Upper abdominal vasculature: Within normal limits.   Mediastinum/Nodes: No enlarged mediastinal, hilar, or axillary lymph nodes. Thyroid  gland, trachea, and esophagus demonstrate no significant findings.   Lungs/Pleura: Similar appearing moderate upper lobe predominant centrilobular emphysema. No focal consolidations. No suspicious pulmonary nodules. No pleural effusion or pneumothorax.   Upper Abdomen: The visualized upper abdomen is within normal limits.   Musculoskeletal: No chest wall abnormality. No acute or significant osseous findings.   Review of the MIP images confirms the above findings.   IMPRESSION: Vascular:   1. Stable fusiform ascending thoracic aortic aneurysm measuring up to 4.5 cm. Ascending thoracic aortic aneurysm. Recommend semi-annual imaging followup by CTA or MRA and referral to cardiothoracic surgery if not already obtained. This recommendation follows 2010 ACCF/AHA/AATS/ACR/ASA/SCA/SCAI/SIR/STS/SVM Guidelines for the Diagnosis and Management of Patients With Thoracic Aortic Disease. Circulation. 2010; 121: Z733-z630. Aortic aneurysm NOS (ICD10-I71.9) 2. Coronary and aortic atherosclerosis (ICD10-I70.0).   Non-Vascular:   Emphysema (ICD10-J43.9).   Ester Sides, MD   [facility]   [facility]     Electronically Signed   By: Ester Sides M.D.   On: 02/03/2024 10:42     A/P: Aneurysm of ascending aorta without rupture -4.5 cm ascending thoracic  aortic aneurysm on CTA of chest.  -We discussed the natural history and and risk factors for growth of ascending aortic aneurysms. Discussed recommendations to minimize the risk of further expansion or dissection including careful blood pressure control, avoidance of contact sports and heavy lifting, attention to lipid management.  We covered the importance of continued smoking cessation.  The patient does not yet meet surgical criteria of >5.5cm. The patient is aware of signs and symptoms of aortic dissection and when to present to the emergency department   -Follow up in one year with CTA of chest for continued surveillance     Risk Modification:  Statin:  atorvastatin   Smoking cessation instruction/counseling given:  commended patient for quitting and reviewed strategies for preventing relapses  Patient was counseled on importance of Blood Pressure Control  They are instructed to contact their Primary Care Physician if they start to have blood pressure readings over 130s/90s. Do not ever stop blood pressure medications on your own, unless instructed by healthcare professional.  Please avoid use of Fluoroquinolones as this can potentially increase your risk of Aortic Rupture and/or Dissection  Patient educated on signs and symptoms of Aortic Dissection, handout also provided in AVS  Manuelita CHRISTELLA Rough, PA-C  02/25/24  

## 2024-02-25 ENCOUNTER — Ambulatory Visit

## 2024-02-25 VITALS — BP 136/72 | HR 51 | Resp 18 | Ht 72.0 in | Wt 194.0 lb

## 2024-02-25 DIAGNOSIS — I7121 Aneurysm of the ascending aorta, without rupture: Secondary | ICD-10-CM

## 2024-02-25 NOTE — Patient Instructions (Signed)

## 2024-03-08 ENCOUNTER — Other Ambulatory Visit: Payer: Self-pay | Admitting: Vascular Surgery

## 2024-03-08 DIAGNOSIS — I723 Aneurysm of iliac artery: Secondary | ICD-10-CM

## 2024-03-16 ENCOUNTER — Ambulatory Visit (HOSPITAL_COMMUNITY)
Admission: RE | Admit: 2024-03-16 | Discharge: 2024-03-16 | Disposition: A | Discharge status: Home / Self Care | Source: Ambulatory Visit | Attending: Internal Medicine | Admitting: Internal Medicine

## 2024-03-16 DIAGNOSIS — I2510 Atherosclerotic heart disease of native coronary artery without angina pectoris: Secondary | ICD-10-CM | POA: Diagnosis not present

## 2024-03-16 DIAGNOSIS — I10 Essential (primary) hypertension: Secondary | ICD-10-CM | POA: Diagnosis not present

## 2024-03-16 DIAGNOSIS — E782 Mixed hyperlipidemia: Secondary | ICD-10-CM | POA: Insufficient documentation

## 2024-03-16 DIAGNOSIS — I723 Aneurysm of iliac artery: Secondary | ICD-10-CM | POA: Insufficient documentation

## 2024-03-16 DIAGNOSIS — I350 Nonrheumatic aortic (valve) stenosis: Secondary | ICD-10-CM | POA: Diagnosis not present

## 2024-03-16 DIAGNOSIS — I712 Thoracic aortic aneurysm, without rupture, unspecified: Secondary | ICD-10-CM | POA: Diagnosis not present

## 2024-03-16 LAB — ECHOCARDIOGRAM COMPLETE
AR max vel: 1.68 cm2
AV Area VTI: 1.66 cm2
AV Area mean vel: 1.66 cm2
AV Mean grad: 17 mmHg
AV Peak grad: 29.6 mmHg
Ao pk vel: 2.72 m/s
Area-P 1/2: 3.17 cm2
S' Lateral: 3.1 cm

## 2024-03-17 NOTE — Addendum Note (Signed)
 Addended by: Braidyn Scorsone W on: 03/17/2024 09:50 AM   Modules accepted: Orders

## 2024-03-24 ENCOUNTER — Encounter: Payer: Self-pay | Admitting: *Deleted

## 2024-04-12 NOTE — Progress Notes (Addendum)
 QUADIR MUNS                                          MRN: [id number]   05/04/2024   The [facility] Quality Team Specialist reviewed this patient medical record for the purposes of chart review for care gap closure. The following were reviewed: abstraction for care gap closure-controlling blood pressure.    [facility] Quality Team

## 2024-04-26 ENCOUNTER — Other Ambulatory Visit: Payer: Self-pay | Admitting: Cardiology
